# Patient Record
Sex: Female | Born: 1945 | Race: White | Hispanic: No | Marital: Single | Smoking: Current every day smoker
Health system: Southern US, Community
[De-identification: ages and names within clinical notes are randomized; demographics above are authoritative.]

## PROBLEM LIST (undated history)

## (undated) DIAGNOSIS — F32A Depression, unspecified: Secondary | ICD-10-CM

## (undated) DIAGNOSIS — M810 Age-related osteoporosis without current pathological fracture: Secondary | ICD-10-CM

## (undated) DIAGNOSIS — M1990 Unspecified osteoarthritis, unspecified site: Secondary | ICD-10-CM

## (undated) DIAGNOSIS — I509 Heart failure, unspecified: Secondary | ICD-10-CM

## (undated) DIAGNOSIS — F419 Anxiety disorder, unspecified: Secondary | ICD-10-CM

## (undated) DIAGNOSIS — F329 Major depressive disorder, single episode, unspecified: Secondary | ICD-10-CM

## (undated) DIAGNOSIS — E079 Disorder of thyroid, unspecified: Secondary | ICD-10-CM

## (undated) DIAGNOSIS — K649 Unspecified hemorrhoids: Secondary | ICD-10-CM

## (undated) DIAGNOSIS — D649 Anemia, unspecified: Secondary | ICD-10-CM

## (undated) DIAGNOSIS — E039 Hypothyroidism, unspecified: Secondary | ICD-10-CM

## (undated) DIAGNOSIS — I10 Essential (primary) hypertension: Secondary | ICD-10-CM

## (undated) DIAGNOSIS — M25519 Pain in unspecified shoulder: Secondary | ICD-10-CM

## (undated) DIAGNOSIS — K219 Gastro-esophageal reflux disease without esophagitis: Secondary | ICD-10-CM

## (undated) HISTORY — PX: HAND SURGERY: SHX662

## (undated) HISTORY — DX: Gastro-esophageal reflux disease without esophagitis: K21.9

## (undated) HISTORY — DX: Age-related osteoporosis without current pathological fracture: M81.0

## (undated) HISTORY — PX: COLONOSCOPY: SHX174

## (undated) HISTORY — DX: Disorder of thyroid, unspecified: E07.9

## (undated) HISTORY — DX: Unspecified hemorrhoids: K64.9

## (undated) HISTORY — PX: UPPER GI ENDOSCOPY: SHX6162

## (undated) HISTORY — PX: ABDOMINAL HYSTERECTOMY: SHX81

---

## 2004-02-27 ENCOUNTER — Other Ambulatory Visit: Payer: Self-pay

## 2004-08-21 ENCOUNTER — Ambulatory Visit: Payer: Self-pay | Admitting: Internal Medicine

## 2005-08-28 ENCOUNTER — Ambulatory Visit: Payer: Self-pay | Admitting: Internal Medicine

## 2006-08-31 ENCOUNTER — Ambulatory Visit: Payer: Self-pay | Admitting: Internal Medicine

## 2007-02-08 ENCOUNTER — Ambulatory Visit: Payer: Self-pay | Admitting: Internal Medicine

## 2010-01-14 ENCOUNTER — Ambulatory Visit: Payer: Self-pay | Admitting: Internal Medicine

## 2010-05-14 ENCOUNTER — Ambulatory Visit: Payer: Self-pay | Admitting: Unknown Physician Specialty

## 2011-01-29 ENCOUNTER — Ambulatory Visit: Payer: Self-pay | Admitting: Internal Medicine

## 2011-06-16 ENCOUNTER — Ambulatory Visit: Payer: Self-pay | Admitting: Internal Medicine

## 2012-02-29 ENCOUNTER — Ambulatory Visit: Payer: Self-pay | Admitting: Internal Medicine

## 2013-05-23 ENCOUNTER — Ambulatory Visit: Payer: Self-pay | Admitting: Physician Assistant

## 2013-09-26 ENCOUNTER — Ambulatory Visit: Payer: Self-pay | Admitting: Internal Medicine

## 2013-10-26 ENCOUNTER — Ambulatory Visit: Payer: Self-pay | Admitting: Physician Assistant

## 2013-12-07 ENCOUNTER — Ambulatory Visit: Payer: Self-pay | Admitting: Physician Assistant

## 2014-05-24 ENCOUNTER — Ambulatory Visit: Payer: Self-pay | Admitting: Physician Assistant

## 2015-03-08 DIAGNOSIS — R195 Other fecal abnormalities: Secondary | ICD-10-CM | POA: Insufficient documentation

## 2015-03-08 DIAGNOSIS — K219 Gastro-esophageal reflux disease without esophagitis: Secondary | ICD-10-CM | POA: Insufficient documentation

## 2015-03-08 DIAGNOSIS — D509 Iron deficiency anemia, unspecified: Secondary | ICD-10-CM | POA: Insufficient documentation

## 2015-05-15 ENCOUNTER — Encounter
Admission: RE | Disposition: A | Discharge status: Home / Self Care | Payer: Self-pay | Source: Ambulatory Visit | Attending: Unknown Physician Specialty

## 2015-05-15 ENCOUNTER — Ambulatory Visit: Payer: PPO | Admitting: Anesthesiology

## 2015-05-15 ENCOUNTER — Ambulatory Visit
Admission: RE | Admit: 2015-05-15 | Discharge: 2015-05-15 | Disposition: A | Discharge status: Home / Self Care | Payer: PPO | Source: Ambulatory Visit | Attending: Unknown Physician Specialty | Admitting: Unknown Physician Specialty

## 2015-05-15 DIAGNOSIS — K319 Disease of stomach and duodenum, unspecified: Secondary | ICD-10-CM | POA: Diagnosis not present

## 2015-05-15 DIAGNOSIS — K259 Gastric ulcer, unspecified as acute or chronic, without hemorrhage or perforation: Secondary | ICD-10-CM | POA: Diagnosis not present

## 2015-05-15 DIAGNOSIS — I10 Essential (primary) hypertension: Secondary | ICD-10-CM | POA: Insufficient documentation

## 2015-05-15 DIAGNOSIS — F419 Anxiety disorder, unspecified: Secondary | ICD-10-CM | POA: Diagnosis not present

## 2015-05-15 DIAGNOSIS — F17200 Nicotine dependence, unspecified, uncomplicated: Secondary | ICD-10-CM | POA: Diagnosis not present

## 2015-05-15 DIAGNOSIS — M1990 Unspecified osteoarthritis, unspecified site: Secondary | ICD-10-CM | POA: Diagnosis not present

## 2015-05-15 DIAGNOSIS — R195 Other fecal abnormalities: Secondary | ICD-10-CM | POA: Insufficient documentation

## 2015-05-15 DIAGNOSIS — F329 Major depressive disorder, single episode, unspecified: Secondary | ICD-10-CM | POA: Insufficient documentation

## 2015-05-15 DIAGNOSIS — K210 Gastro-esophageal reflux disease with esophagitis: Secondary | ICD-10-CM | POA: Insufficient documentation

## 2015-05-15 DIAGNOSIS — Z79899 Other long term (current) drug therapy: Secondary | ICD-10-CM | POA: Insufficient documentation

## 2015-05-15 DIAGNOSIS — D509 Iron deficiency anemia, unspecified: Secondary | ICD-10-CM | POA: Insufficient documentation

## 2015-05-15 DIAGNOSIS — K2970 Gastritis, unspecified, without bleeding: Secondary | ICD-10-CM | POA: Diagnosis not present

## 2015-05-15 DIAGNOSIS — D125 Benign neoplasm of sigmoid colon: Secondary | ICD-10-CM | POA: Insufficient documentation

## 2015-05-15 HISTORY — DX: Anxiety disorder, unspecified: F41.9

## 2015-05-15 HISTORY — DX: Essential (primary) hypertension: I10

## 2015-05-15 HISTORY — DX: Depression, unspecified: F32.A

## 2015-05-15 HISTORY — PX: ESOPHAGOGASTRODUODENOSCOPY (EGD) WITH PROPOFOL: SHX5813

## 2015-05-15 HISTORY — DX: Unspecified osteoarthritis, unspecified site: M19.90

## 2015-05-15 HISTORY — DX: Anemia, unspecified: D64.9

## 2015-05-15 HISTORY — DX: Major depressive disorder, single episode, unspecified: F32.9

## 2015-05-15 HISTORY — PX: COLONOSCOPY WITH PROPOFOL: SHX5780

## 2015-05-15 SURGERY — COLONOSCOPY WITH PROPOFOL
Anesthesia: General

## 2015-05-15 MED ORDER — EPHEDRINE SULFATE 50 MG/ML IJ SOLN
INTRAMUSCULAR | Status: DC | PRN
  Administered 2015-05-15 (×2): 10 mg via INTRAVENOUS

## 2015-05-15 MED ORDER — SODIUM CHLORIDE 0.9 % IV SOLN
INTRAVENOUS | Status: DC
Start: 2015-05-15 — End: 2015-05-17

## 2015-05-15 MED ORDER — LACTATED RINGERS IV SOLN
INTRAVENOUS | Status: DC | PRN
  Administered 2015-05-15: 10:00:00 via INTRAVENOUS

## 2015-05-15 MED ORDER — PROPOFOL 500 MG/50ML IV EMUL
INTRAVENOUS | Status: DC | PRN
  Administered 2015-05-15: 100 ug/kg/min via INTRAVENOUS

## 2015-05-15 MED ORDER — SODIUM CHLORIDE 0.9 % IV SOLN
INTRAVENOUS | Status: DC
  Administered 2015-05-15: 1000 mL via INTRAVENOUS

## 2015-05-15 MED ORDER — SODIUM CHLORIDE 0.9 % IV SOLN: INTRAVENOUS | Status: DC

## 2015-05-15 NOTE — H&P (Signed)
Primary Care Physician:  Lavera Guise, MD Primary Gastroenterologist:  Dr. Vira Agar  Pre-Procedure History & Physical: HPI:  Dawn Carpenter is a 69 y.o. female is here for an endoscopy and colonoscopy.   Past Medical History  Diagnosis Date  . Anxiety   . Depression   . Arthritis   . Hypertension   . Anemia     Past Surgical History  Procedure Laterality Date  . Abdominal hysterectomy    . Colonoscopy    . Upper gi endoscopy    . Hand surgery      for arthritis    Prior to Admission medications   Medication Sig Start Date End Date Taking? Authorizing Provider  Biotin 1 MG CAPS Take 1 tablet by mouth daily.   Yes Historical Provider, MD  calcium carbonate (TUMS - DOSED IN MG ELEMENTAL CALCIUM) 500 MG chewable tablet Chew 1 tablet by mouth daily.   Yes Historical Provider, MD  clonazePAM (KLONOPIN) 0.5 MG tablet Take 0.5 mg by mouth 2 (two) times daily as needed for anxiety.   Yes Historical Provider, MD  conjugated estrogens (PREMARIN) vaginal cream Place 1 Applicatorful vaginally daily.   Yes Historical Provider, MD  escitalopram (LEXAPRO) 10 MG tablet Take 10 mg by mouth daily.   Yes Historical Provider, MD  Ferrous Fumarate-Folic Acid (HEMATINIC/FOLIC ACID) 992-4 MG TABS Take 1 tablet by mouth daily.   Yes Historical Provider, MD  levothyroxine (SYNTHROID, LEVOTHROID) 50 MCG tablet Take 50 mcg by mouth daily before breakfast.   Yes Historical Provider, MD  mirtazapine (REMERON) 30 MG tablet Take 30 mg by mouth at bedtime.   Yes Historical Provider, MD  Multiple Vitamin (MULTIVITAMIN) capsule Take 1 capsule by mouth daily.   Yes Historical Provider, MD  promethazine (PHENERGAN) 25 MG tablet Take 25 mg by mouth every 6 (six) hours as needed for nausea or vomiting.   Yes Historical Provider, MD  valsartan (DIOVAN) 160 MG tablet Take 160 mg by mouth daily.   Yes Historical Provider, MD    Allergies as of 03/12/2015  . (Not on File)    History reviewed. No pertinent  family history.  Social History   Social History  . Marital Status: Single    Spouse Name: N/A  . Number of Children: N/A  . Years of Education: N/A   Occupational History  . Not on file.   Social History Main Topics  . Smoking status: Current Every Day Smoker  . Smokeless tobacco: Not on file  . Alcohol Use: Not on file  . Drug Use: Not on file  . Sexual Activity: Not on file   Other Topics Concern  . Not on file   Social History Narrative  . No narrative on file    Review of Systems: See HPI, otherwise negative ROS  Physical Exam: BP 158/87 mmHg  Pulse 67  Temp(Src) 97.9 F (36.6 C) (Tympanic)  Resp 15  Ht 5\' 4"  (1.626 m)  Wt 54.432 kg (120 lb)  BMI 20.59 kg/m2  SpO2 97% General:   Alert,  pleasant and cooperative in NAD Head:  Normocephalic and atraumatic. Neck:  Supple; no masses or thyromegaly. Lungs:  Clear throughout to auscultation.    Heart:  Regular rate and rhythm. Abdomen:  Soft, nontender and nondistended. Normal bowel sounds, without guarding, and without rebound.   Neurologic:  Alert and  oriented x4;  grossly normal neurologically.  Impression/Plan: Dawn Carpenter is here for an endoscopy and colonoscopy to be performed for  heme positive stool  Risks, benefits, limitations, and alternatives regarding  endoscopy and colonoscopy have been reviewed with the patient.  Questions have been answered.  All parties agreeable.   Gaylyn Cheers, MD  05/15/2015, 1:58 PM

## 2015-05-15 NOTE — Anesthesia Postprocedure Evaluation (Signed)
  Anesthesia Post-op Note  Patient: Dawn Carpenter  Procedure(s) Performed: Procedure(s): COLONOSCOPY WITH PROPOFOL (N/A) ESOPHAGOGASTRODUODENOSCOPY (EGD) WITH PROPOFOL (N/A)  Anesthesia type:No value filed.  Patient location: PACU  Post pain: Pain level controlled  Post assessment: Post-op Vital signs reviewed, Patient's Cardiovascular Status Stable, Respiratory Function Stable, Patent Airway and No signs of Nausea or vomiting  Post vital signs: Reviewed and stable  Last Vitals:  Filed Vitals:   05/15/15 1050  BP: 158/87  Pulse: 67  Temp:   Resp: 15    Level of consciousness: awake, alert  and patient cooperative  Complications: No apparent anesthesia complications

## 2015-05-15 NOTE — Op Note (Signed)
[facility] Gastroenterology Patient Name: Dawn Carpenter Procedure Date: 05/15/2015 9:23 AM MRN: [id number] Account #: [id number] Date of Birth: 05-Sep-1945 Admit Type: Outpatient Age: 69 Room: [facility] ENDO ROOM 4 Gender: Female Note Status: Finalized Procedure:         Upper GI endoscopy Indications:       Iron deficiency anemia, Heme positive stool Providers:         Manya Silvas, MD Referring MD:      Lavera Guise, MD (Referring MD) Medicines:         Propofol per Anesthesia Complications:     No immediate complications. Procedure:         Pre-Anesthesia Assessment:                    - After reviewing the risks and benefits, the patient was                     deemed in satisfactory condition to undergo the procedure.                    After obtaining informed consent, the endoscope was passed                     under direct vision. Throughout the procedure, the                     patient's blood pressure, pulse, and oxygen saturations                     were monitored continuously. The Endoscope was introduced                     through the mouth, and advanced to the second part of                     duodenum. The upper GI endoscopy was accomplished without                     difficulty. The patient tolerated the procedure well. Findings:      LA Grade C (one or more mucosal breaks continuous between tops of 2 or       more mucosal folds, less than 75% circumference) esophagitis with no       bleeding was found 39 cm from the incisors. This extended to about 27cm       from the teeth. Biopsies were taken with a cold forceps for histology.      Two non-bleeding cratered gastric ulcers with no stigmata of bleeding       were found in the gastric antrum. The largest lesion was 3 mm in largest       dimension.      Diffuse and patchy moderate inflammation characterized by erythema and       granularity was found in the gastric antrum. Biopsies were  taken with a       cold forceps for Helicobacter pylori testing. Biopsies were taken with a       cold forceps for histology.      Diffuse mildly erythematous mucosa without active bleeding and with no       stigmata of bleeding was found in the duodenal bulb. 2ed portion was       normal. Impression:        - LA Grade C reflux esophagitis. Biopsied.                    -  Gastric ulcers with clean base.                    - Gastritis. Biopsied.                    - Erythematous duodenopathy. Recommendation:    - Await pathology results. Avoid Advil, Aleve, Ibuprofen,                     etc. Take medicine for stomach. Manya Silvas, MD 05/15/2015 10:24:42 AM This report has been signed electronically. Number of Addenda: 0 Note Initiated On: 05/15/2015 9:23 AM      [facility]

## 2015-05-15 NOTE — Transfer of Care (Signed)
Immediate Anesthesia Transfer of Care Note  Patient: Coty Student  Procedure(s) Performed: Procedure(s): COLONOSCOPY WITH PROPOFOL (N/A) ESOPHAGOGASTRODUODENOSCOPY (EGD) WITH PROPOFOL (N/A)  Patient Location: PACU  Anesthesia Type:General  Level of Consciousness: awake and alert   Airway & Oxygen Therapy: Patient Spontanous Breathing and Patient connected to nasal cannula oxygen  Post-op Assessment: Report given to RN and Post -op Vital signs reviewed and stable  Post vital signs: Reviewed and stable  Last Vitals:  Filed Vitals:   05/15/15 1050  BP: 158/87  Pulse: 67  Temp:   Resp: 15    Complications: No apparent anesthesia complications

## 2015-05-15 NOTE — Op Note (Signed)
[facility] Gastroenterology Patient Name: Dawn Carpenter Procedure Date: 05/15/2015 9:23 AM MRN: [id number] Account #: [id number] Date of Birth: 1946-02-23 Admit Type: Outpatient Age: 68 Room: [facility] ENDO ROOM 4 Gender: Female Note Status: Finalized Procedure:         Colonoscopy Indications:       Heme positive stool, Unexplained iron deficiency anemia Providers:         Manya Silvas, MD Referring MD:      Lavera Guise, MD (Referring MD) Medicines:         Propofol per Anesthesia Complications:     No immediate complications. Procedure:         Pre-Anesthesia Assessment:                    - After reviewing the risks and benefits, the patient was                     deemed in satisfactory condition to undergo the procedure.                    After obtaining informed consent, the colonoscope was                     passed under direct vision. Throughout the procedure, the                     patient's blood pressure, pulse, and oxygen saturations                     were monitored continuously. The Colonoscope was                     introduced through the anus and advanced to the the cecum,                     identified by appendiceal orifice and ileocecal valve. The                     colonoscopy was somewhat difficult due to restricted                     mobility of the colon. Successful completion of the                     procedure was aided by applying abdominal pressure. The                     patient tolerated the procedure well. The quality of the                     bowel preparation was excellent. Findings:      The distal ileum was normal      Internal hemorrhoids were found. The hemorrhoids were small and Grade I       (internal hemorrhoids that do not prolapse).      A diminutive polyp was found in the recto-sigmoid colon. The polyp was       sessile. The polyp was removed with a jumbo cold forceps. Resection and       retrieval were  complete.      The exam was otherwise without abnormality. No findings of anything that       could cause iron def anemia. Impression:        - Internal hemorrhoids.                    -  One diminutive polyp at the recto-sigmoid colon.                     Resected and retrieved.                    - The examination was otherwise normal. Recommendation:    - Await pathology results. Manya Silvas, MD 05/15/2015 10:08:19 AM This report has been signed electronically. Number of Addenda: 0 Note Initiated On: 05/15/2015 9:23 AM Scope Withdrawal Time: 0 hours 15 minutes 0 seconds  Total Procedure Duration: 0 hours 24 minutes 53 seconds       [facility]

## 2015-05-15 NOTE — Anesthesia Preprocedure Evaluation (Addendum)
Anesthesia Evaluation  Patient identified by MRN, date of birth, ID band  Reviewed: Allergy & Precautions, NPO status , reviewed documented beta blocker date and time   History of Anesthesia Complications (+) AWARENESS UNDER ANESTHESIA  Airway Mallampati: II  TM Distance: >3 FB     Dental  (+) Missing, Dental Advisory Given   Pulmonary Current Smoker,    Pulmonary exam normal        Cardiovascular hypertension,  Rhythm:Regular Rate:Normal     Neuro/Psych Anxiety    GI/Hepatic   Endo/Other  Hypothyroidism   Renal/GU      Musculoskeletal  (+) Arthritis ,   Abdominal Normal abdominal exam  (+)   Peds  Hematology  (+) anemia ,   Anesthesia Other Findings   Reproductive/Obstetrics                           Anesthesia Physical Anesthesia Plan  ASA: III  Anesthesia Plan: General   Post-op Pain Management:    Induction:   Airway Management Planned:   Additional Equipment:   Intra-op Plan:   Post-operative Plan:   Informed Consent:   Plan Discussed with: CRNA  Anesthesia Plan Comments:         Anesthesia Quick Evaluation

## 2015-05-16 ENCOUNTER — Encounter: Payer: Self-pay | Admitting: Unknown Physician Specialty

## 2015-05-16 LAB — SURGICAL PATHOLOGY

## 2015-05-23 NOTE — Addendum Note (Signed)
Addendum  created 05/23/15 2006 by Wilmer Floor, MD   Modules edited: Anesthesia Attestations

## 2015-05-27 ENCOUNTER — Other Ambulatory Visit: Payer: Self-pay | Admitting: Internal Medicine

## 2015-05-27 DIAGNOSIS — Z1231 Encounter for screening mammogram for malignant neoplasm of breast: Secondary | ICD-10-CM

## 2015-06-06 ENCOUNTER — Ambulatory Visit
Admission: RE | Admit: 2015-06-06 | Discharge: 2015-06-06 | Disposition: A | Discharge status: Home / Self Care | Payer: PPO | Source: Ambulatory Visit | Attending: Internal Medicine | Admitting: Internal Medicine

## 2015-06-06 DIAGNOSIS — Z1231 Encounter for screening mammogram for malignant neoplasm of breast: Secondary | ICD-10-CM | POA: Diagnosis not present

## 2015-09-24 DIAGNOSIS — D509 Iron deficiency anemia, unspecified: Secondary | ICD-10-CM | POA: Diagnosis not present

## 2015-09-24 DIAGNOSIS — F333 Major depressive disorder, recurrent, severe with psychotic symptoms: Secondary | ICD-10-CM | POA: Diagnosis not present

## 2015-09-24 DIAGNOSIS — I10 Essential (primary) hypertension: Secondary | ICD-10-CM | POA: Diagnosis not present

## 2015-09-24 DIAGNOSIS — K580 Irritable bowel syndrome with diarrhea: Secondary | ICD-10-CM | POA: Diagnosis not present

## 2015-09-24 DIAGNOSIS — E039 Hypothyroidism, unspecified: Secondary | ICD-10-CM | POA: Diagnosis not present

## 2015-11-21 DIAGNOSIS — F333 Major depressive disorder, recurrent, severe with psychotic symptoms: Secondary | ICD-10-CM | POA: Diagnosis not present

## 2015-11-21 DIAGNOSIS — K580 Irritable bowel syndrome with diarrhea: Secondary | ICD-10-CM | POA: Diagnosis not present

## 2015-11-21 DIAGNOSIS — F17210 Nicotine dependence, cigarettes, uncomplicated: Secondary | ICD-10-CM | POA: Diagnosis not present

## 2015-11-21 DIAGNOSIS — E039 Hypothyroidism, unspecified: Secondary | ICD-10-CM | POA: Diagnosis not present

## 2016-01-14 ENCOUNTER — Telehealth: Payer: Self-pay | Admitting: *Deleted

## 2016-01-14 NOTE — Telephone Encounter (Signed)
Received referral for initial lung cancer screening scan. Contacted patient and obtained smoking history,(current smoker, 30 pack year history) as well as answering questions related to screening process. Patient denies signs of lung cancer such as weight loss or hemoptysis. Patient denies comorbidity that would prevent curative treatment if lung cancer were found. Patient is tentatively scheduled for shared decision making visit and CT scan on 01/24/16 at 2pm, pending insurance approval from business office.

## 2016-01-23 ENCOUNTER — Other Ambulatory Visit: Payer: Self-pay | Admitting: Family Medicine

## 2016-01-23 DIAGNOSIS — Z87891 Personal history of nicotine dependence: Secondary | ICD-10-CM

## 2016-01-24 ENCOUNTER — Encounter: Payer: Self-pay | Admitting: Family Medicine

## 2016-01-24 ENCOUNTER — Ambulatory Visit
Admission: RE | Admit: 2016-01-24 | Discharge: 2016-01-24 | Disposition: A | Discharge status: Home / Self Care | Payer: PPO | Source: Ambulatory Visit | Attending: Family Medicine | Admitting: Family Medicine

## 2016-01-24 ENCOUNTER — Inpatient Hospital Stay: Payer: PPO | Attending: Family Medicine | Admitting: Family Medicine

## 2016-01-24 DIAGNOSIS — Z23 Encounter for immunization: Secondary | ICD-10-CM | POA: Diagnosis not present

## 2016-01-24 DIAGNOSIS — Z87891 Personal history of nicotine dependence: Secondary | ICD-10-CM | POA: Insufficient documentation

## 2016-01-24 DIAGNOSIS — I2510 Atherosclerotic heart disease of native coronary artery without angina pectoris: Secondary | ICD-10-CM | POA: Diagnosis not present

## 2016-01-24 DIAGNOSIS — R911 Solitary pulmonary nodule: Secondary | ICD-10-CM | POA: Diagnosis not present

## 2016-01-24 DIAGNOSIS — I700 Atherosclerosis of aorta: Secondary | ICD-10-CM | POA: Insufficient documentation

## 2016-01-24 DIAGNOSIS — D509 Iron deficiency anemia, unspecified: Secondary | ICD-10-CM | POA: Diagnosis not present

## 2016-01-24 DIAGNOSIS — Z122 Encounter for screening for malignant neoplasm of respiratory organs: Secondary | ICD-10-CM | POA: Diagnosis not present

## 2016-01-24 DIAGNOSIS — E039 Hypothyroidism, unspecified: Secondary | ICD-10-CM | POA: Diagnosis not present

## 2016-01-24 DIAGNOSIS — I10 Essential (primary) hypertension: Secondary | ICD-10-CM | POA: Diagnosis not present

## 2016-01-24 DIAGNOSIS — F17210 Nicotine dependence, cigarettes, uncomplicated: Secondary | ICD-10-CM | POA: Diagnosis not present

## 2016-01-24 DIAGNOSIS — Z0001 Encounter for general adult medical examination with abnormal findings: Secondary | ICD-10-CM | POA: Diagnosis not present

## 2016-01-24 DIAGNOSIS — F333 Major depressive disorder, recurrent, severe with psychotic symptoms: Secondary | ICD-10-CM | POA: Diagnosis not present

## 2016-01-24 DIAGNOSIS — M810 Age-related osteoporosis without current pathological fracture: Secondary | ICD-10-CM | POA: Diagnosis not present

## 2016-01-24 NOTE — Progress Notes (Signed)
In accordance with CMS guidelines, patient has meet eligibility criteria including age, absence of signs or symptoms of lung cancer, the specific calculation of cigarette smoking pack-years was 30 years and is a current smoker.   A shared decision-making session was conducted prior to the performance of CT scan. This includes one or more decision aids, includes benefits and harms of screening, follow-up diagnostic testing, over-diagnosis, false positive rate, and total radiation exposure.  Counseling on the importance of adherence to annual lung cancer LDCT screening, impact of co-morbidities, and ability or willingness to undergo diagnosis and treatment is imperative for compliance of the program.  Counseling on the importance of continued smoking cessation for former smokers; the importance of smoking cessation for current smokers and information about tobacco cessation interventions have been given to patient including the [place] [facility], 1800 quit [place], as well as Cancer Center specific smoking cessation programs.  Written order for lung cancer screening with LDCT has been given to the patient and any and all questions have been answered to the best of my abilities.   Yearly follow up will be scheduled by Shawn Perkins, Thoracic Navigator.   

## 2016-01-27 ENCOUNTER — Other Ambulatory Visit: Payer: Self-pay | Admitting: Family Medicine

## 2016-02-04 DIAGNOSIS — F17210 Nicotine dependence, cigarettes, uncomplicated: Secondary | ICD-10-CM | POA: Diagnosis not present

## 2016-02-04 DIAGNOSIS — R0602 Shortness of breath: Secondary | ICD-10-CM | POA: Diagnosis not present

## 2016-02-04 DIAGNOSIS — F419 Anxiety disorder, unspecified: Secondary | ICD-10-CM | POA: Diagnosis not present

## 2016-02-04 DIAGNOSIS — R911 Solitary pulmonary nodule: Secondary | ICD-10-CM | POA: Diagnosis not present

## 2016-02-10 ENCOUNTER — Other Ambulatory Visit: Payer: Self-pay | Admitting: Internal Medicine

## 2016-02-10 DIAGNOSIS — R911 Solitary pulmonary nodule: Secondary | ICD-10-CM

## 2016-02-19 DIAGNOSIS — R0602 Shortness of breath: Secondary | ICD-10-CM | POA: Diagnosis not present

## 2016-02-19 LAB — PULMONARY FUNCTION TEST

## 2016-02-25 ENCOUNTER — Encounter
Admission: RE | Admit: 2016-02-25 | Discharge: 2016-02-25 | Disposition: A | Discharge status: Home / Self Care | Payer: PPO | Source: Ambulatory Visit | Attending: Internal Medicine | Admitting: Internal Medicine

## 2016-02-25 DIAGNOSIS — R911 Solitary pulmonary nodule: Secondary | ICD-10-CM | POA: Insufficient documentation

## 2016-02-25 LAB — GLUCOSE, CAPILLARY: Glucose-Capillary: 96 mg/dL (ref 65–99)

## 2016-02-25 MED ORDER — FLUDEOXYGLUCOSE F - 18 (FDG) INJECTION
12.0700 | Freq: Once | INTRAVENOUS | Status: AC | PRN
  Administered 2016-02-25: 12.07 via INTRAVENOUS

## 2016-03-05 DIAGNOSIS — F17210 Nicotine dependence, cigarettes, uncomplicated: Secondary | ICD-10-CM | POA: Diagnosis not present

## 2016-03-05 DIAGNOSIS — F419 Anxiety disorder, unspecified: Secondary | ICD-10-CM | POA: Diagnosis not present

## 2016-03-05 DIAGNOSIS — R0602 Shortness of breath: Secondary | ICD-10-CM | POA: Diagnosis not present

## 2016-03-05 DIAGNOSIS — R911 Solitary pulmonary nodule: Secondary | ICD-10-CM | POA: Diagnosis not present

## 2016-03-18 ENCOUNTER — Other Ambulatory Visit: Payer: Self-pay | Admitting: Internal Medicine

## 2016-03-18 DIAGNOSIS — R911 Solitary pulmonary nodule: Secondary | ICD-10-CM

## 2016-03-30 ENCOUNTER — Other Ambulatory Visit: Payer: Self-pay | Admitting: Radiology

## 2016-03-31 ENCOUNTER — Ambulatory Visit
Admission: RE | Admit: 2016-03-31 | Discharge: 2016-03-31 | Disposition: A | Discharge status: Home / Self Care | Payer: PPO | Source: Ambulatory Visit | Attending: Interventional Radiology | Admitting: Interventional Radiology

## 2016-03-31 ENCOUNTER — Ambulatory Visit
Admission: RE | Admit: 2016-03-31 | Discharge: 2016-03-31 | Disposition: A | Discharge status: Home / Self Care | Payer: PPO | Source: Ambulatory Visit | Attending: Internal Medicine | Admitting: Internal Medicine

## 2016-03-31 DIAGNOSIS — F419 Anxiety disorder, unspecified: Secondary | ICD-10-CM | POA: Insufficient documentation

## 2016-03-31 DIAGNOSIS — I10 Essential (primary) hypertension: Secondary | ICD-10-CM | POA: Insufficient documentation

## 2016-03-31 DIAGNOSIS — R911 Solitary pulmonary nodule: Secondary | ICD-10-CM

## 2016-03-31 DIAGNOSIS — M1990 Unspecified osteoarthritis, unspecified site: Secondary | ICD-10-CM | POA: Diagnosis not present

## 2016-03-31 DIAGNOSIS — F329 Major depressive disorder, single episode, unspecified: Secondary | ICD-10-CM | POA: Diagnosis not present

## 2016-03-31 DIAGNOSIS — J939 Pneumothorax, unspecified: Secondary | ICD-10-CM | POA: Diagnosis not present

## 2016-03-31 DIAGNOSIS — D649 Anemia, unspecified: Secondary | ICD-10-CM | POA: Insufficient documentation

## 2016-03-31 DIAGNOSIS — R918 Other nonspecific abnormal finding of lung field: Secondary | ICD-10-CM | POA: Insufficient documentation

## 2016-03-31 DIAGNOSIS — F17210 Nicotine dependence, cigarettes, uncomplicated: Secondary | ICD-10-CM | POA: Insufficient documentation

## 2016-03-31 DIAGNOSIS — J95811 Postprocedural pneumothorax: Secondary | ICD-10-CM

## 2016-03-31 LAB — CBC WITH DIFFERENTIAL/PLATELET
BASOS ABS: 0.1 10*3/uL (ref 0–0.1)
Basophils Relative: 1 %
EOS PCT: 4 %
Eosinophils Absolute: 0.2 10*3/uL (ref 0–0.7)
HCT: 34.1 % — ABNORMAL LOW (ref 35.0–47.0)
Hemoglobin: 11.9 g/dL — ABNORMAL LOW (ref 12.0–16.0)
LYMPHS PCT: 26 %
Lymphs Abs: 1.6 10*3/uL (ref 1.0–3.6)
MCH: 30.3 pg (ref 26.0–34.0)
MCHC: 34.9 g/dL (ref 32.0–36.0)
MCV: 86.7 fL (ref 80.0–100.0)
Monocytes Absolute: 0.7 10*3/uL (ref 0.2–0.9)
Monocytes Relative: 11 %
NEUTROS ABS: 3.7 10*3/uL (ref 1.4–6.5)
Neutrophils Relative %: 58 %
PLATELETS: 325 10*3/uL (ref 150–440)
RBC: 3.94 MIL/uL (ref 3.80–5.20)
RDW: 21.1 % — ABNORMAL HIGH (ref 11.5–14.5)
WBC: 6.4 10*3/uL (ref 3.6–11.0)

## 2016-03-31 LAB — PROTIME-INR
INR: 0.9
Prothrombin Time: 12.1 seconds (ref 11.4–15.2)

## 2016-03-31 MED ORDER — FENTANYL CITRATE (PF) 100 MCG/2ML IJ SOLN
INTRAMUSCULAR | Status: AC | PRN
  Administered 2016-03-31: 50 ug via INTRAVENOUS
  Administered 2016-03-31: 25 ug via INTRAVENOUS

## 2016-03-31 MED ORDER — MIDAZOLAM HCL 5 MG/5ML IJ SOLN
INTRAMUSCULAR | Status: AC | PRN
  Administered 2016-03-31: 1 mg via INTRAVENOUS
  Administered 2016-03-31: 0.5 mg via INTRAVENOUS
  Administered 2016-03-31: 1 mg via INTRAVENOUS

## 2016-03-31 MED ORDER — HYDROCODONE-ACETAMINOPHEN 5-325 MG PO TABS: 1.0000 | ORAL_TABLET | ORAL | Status: DC | PRN

## 2016-03-31 MED ORDER — SODIUM CHLORIDE 0.9 % IV SOLN
INTRAVENOUS | Status: DC
  Administered 2016-03-31: 11:00:00 via INTRAVENOUS

## 2016-03-31 MED ORDER — FENTANYL CITRATE (PF) 100 MCG/2ML IJ SOLN
INTRAMUSCULAR | Status: AC
  Filled 2016-03-31: qty 2

## 2016-03-31 MED ORDER — MIDAZOLAM HCL 5 MG/5ML IJ SOLN
INTRAMUSCULAR | Status: AC
  Filled 2016-03-31: qty 5

## 2016-03-31 MED ORDER — FENTANYL CITRATE (PF) 100 MCG/2ML IJ SOLN
INTRAMUSCULAR | Status: DC
Start: 2016-03-31 — End: 2016-03-31
  Filled 2016-03-31: qty 2

## 2016-03-31 NOTE — H&P (Signed)
Chief Complaint: Patient was seen in consultation today for biopsy of a left upper lobe lung nodule at the request of [place] A  Referring Physician(s): [place] A  Patient Status: Outpatient  History of Present Illness: Dawn Carpenter is a 70 y.o. 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  Due to peripheral location of the lesion, she has been referred for CT guided percutaneous biopsy of the lesion.  Past Medical History:  Diagnosis Date  . Anemia   . Anxiety   . Arthritis   . Depression   . Hypertension     Past Surgical History:  Procedure Laterality Date  . ABDOMINAL HYSTERECTOMY    . COLONOSCOPY    . COLONOSCOPY WITH PROPOFOL N/A 05/15/2015   Procedure: COLONOSCOPY WITH PROPOFOL;  Surgeon: Manya Silvas, MD;  Location: [facility] ENDOSCOPY;  Service: Endoscopy;  Laterality: N/A;  . ESOPHAGOGASTRODUODENOSCOPY (EGD) WITH PROPOFOL N/A 05/15/2015   Procedure: ESOPHAGOGASTRODUODENOSCOPY (EGD) WITH PROPOFOL;  Surgeon: Manya Silvas, MD;  Location: [facility] ENDOSCOPY;  Service: Endoscopy;  Laterality: N/A;  . HAND SURGERY     for arthritis  . UPPER GI ENDOSCOPY      Allergies: Review of patient's allergies indicates not on file.  Medications: Prior to Admission medications   Medication Sig Start Date End Date Taking? Authorizing Provider  Biotin 1 MG CAPS Take 1 tablet by mouth daily.   Yes Historical Provider, MD  calcium carbonate (TUMS - DOSED IN MG ELEMENTAL CALCIUM) 500 MG chewable tablet Chew 1 tablet by mouth daily.   Yes Historical Provider, MD  escitalopram (LEXAPRO) 10 MG tablet Take 10 mg by mouth daily.   Yes Historical Provider, MD  Ferrous Fumarate-Folic Acid (HEMATINIC/FOLIC ACID) 99991111 MG TABS Take 1 tablet by mouth daily.   Yes Historical Provider, MD  levothyroxine (SYNTHROID, LEVOTHROID) 50 MCG tablet Take 50 mcg by mouth daily before breakfast.   Yes Historical Provider, MD    losartan (COZAAR) 50 MG tablet Take 50 mg by mouth daily.   Yes Historical Provider, MD  mirtazapine (REMERON) 30 MG tablet Take 30 mg by mouth at bedtime.   Yes Historical Provider, MD  Multiple Vitamin (MULTIVITAMIN) capsule Take 1 capsule by mouth daily.   Yes Historical Provider, MD  omeprazole (PRILOSEC) 40 MG capsule Take 40 mg by mouth daily.   Yes Historical Provider, MD  clonazePAM (KLONOPIN) 0.5 MG tablet Take 0.5 mg by mouth 2 (two) times daily as needed for anxiety.    Historical Provider, MD  conjugated estrogens (PREMARIN) vaginal cream Place 1 Applicatorful vaginally daily.    Historical Provider, MD  promethazine (PHENERGAN) 25 MG tablet Take 25 mg by mouth every 6 (six) hours as needed for nausea or vomiting.    Historical Provider, MD  valsartan (DIOVAN) 160 MG tablet Take 160 mg by mouth daily.    Historical Provider, MD     Family History  Problem Relation Age of Onset  . Breast cancer Neg Hx     Social History   Social History  . Marital status: Single    Spouse name: N/A  . Number of children: N/A  . Years of education: N/A   Social History Main Topics  . Smoking status: Current Every Day Smoker    Packs/day: 0.75    Years: 40.00  . Smokeless tobacco: Never Used  . Alcohol use 8.4 oz/week    14 Glasses of wine per week  .  Drug use: No  . Sexual activity: Not Asked   Other Topics Concern  . None   Social History Narrative  . None    Review of Systems: A 12 point ROS discussed and pertinent positives are indicated in the HPI above.  All other systems are negative.  Review of Systems  Constitutional: Negative.   Respiratory: Negative.   Cardiovascular: Negative.   Gastrointestinal: Negative.   Genitourinary: Negative.   Musculoskeletal: Negative.   Neurological: Negative.   All other systems reviewed and are negative.   Vital Signs: BP (!) 143/71   Pulse 77   Temp 97.9 F (36.6 C) (Oral)   Resp 15   Ht 5\' 4"  (1.626 m)   Wt 107 lb (48.5  kg)   SpO2 94%   BMI 18.37 kg/m   Physical Exam  Constitutional: She is oriented to person, place, and time. She appears well-developed and well-nourished. No distress.  HENT:  Head: Normocephalic and atraumatic.  Neck: Neck supple. No JVD present. No tracheal deviation present. No thyromegaly present.  Cardiovascular: Normal rate and regular rhythm.  Exam reveals no gallop and no friction rub.   Murmur heard. Mild, 1/6 systolic ejection murmur  Pulmonary/Chest: Effort normal and breath sounds normal. No stridor. No respiratory distress. She has no wheezes. She has no rales.  Abdominal: Soft. Bowel sounds are normal. She exhibits no distension and no mass. There is no tenderness. There is no rebound and no guarding.  Lymphadenopathy:    She has no cervical adenopathy.  Neurological: She is alert and oriented to person, place, and time.  Skin: She is not diaphoretic.    Imaging: No results found.  Labs:  CBC:  Recent Labs  03/31/16 1022  WBC 6.4  HGB 11.9*  HCT 34.1*  PLT 325    COAGS:  Recent Labs  03/31/16 1022  INR 0.90    Assessment and Plan:  For CT guided biopsy of LUL lung mass today.  Imaging reviewed.  Risks reviewed with patient and her daughter.  Risks and benefits discussed with the patient including, but not limited to bleeding, hemoptysis, respiratory failure requiring intubation, infection, pneumothorax requiring chest tube placement, stroke from air embolism or even death. All of the patient's questions were answered, patient is agreeable to proceed. Consent signed and in chart.  Thank you for this interesting consult.  I greatly enjoyed meeting Marlen Repa V. and look forward to participating in their care.  A copy of this report was sent to the requesting provider on this date.  Electronically SignedAletta Edouard T 03/31/2016, 11:29 AM   I spent a total of  30 Minutes in face to face in clinical consultation, greater than 50% of which  was counseling/coordinating care for biopsy of LUL mass.

## 2016-03-31 NOTE — Procedures (Signed)
Interventional Radiology Procedure Note  Procedure:  CT guided biopsy of LUL lung mass  Complications:  Tiny PTX  Estimated Blood Loss: < 10 mL  Findings:  From posterior approach, 17 G needle advanced to posterior margin of peripheral LUL mass. 18 G core biopsy x 2.  Tiny posterior PTX treated with aspiration through outer needle.  Completion CT shows tiny cresent of air in pleural space.  Plan:  CXR in 2 hours; 2 hour bedrest.  Discharge home if CXR shows no signif PTX.  Venetia Night. Kathlene Cote, M.D Pager:  [phone]

## 2016-04-02 LAB — SURGICAL PATHOLOGY

## 2016-04-07 DIAGNOSIS — R911 Solitary pulmonary nodule: Secondary | ICD-10-CM | POA: Diagnosis not present

## 2016-04-07 DIAGNOSIS — I2584 Coronary atherosclerosis due to calcified coronary lesion: Secondary | ICD-10-CM | POA: Diagnosis not present

## 2016-04-07 DIAGNOSIS — R0602 Shortness of breath: Secondary | ICD-10-CM | POA: Diagnosis not present

## 2016-04-07 DIAGNOSIS — F17210 Nicotine dependence, cigarettes, uncomplicated: Secondary | ICD-10-CM | POA: Diagnosis not present

## 2016-04-08 ENCOUNTER — Telehealth: Payer: Self-pay | Admitting: Cardiothoracic Surgery

## 2016-04-08 NOTE — Telephone Encounter (Signed)
Left voice message for patient to call and schedule appointment with Dr. Genevive Bi for possible lung cancer resection. Referred by the [place]

## 2016-04-09 DIAGNOSIS — R079 Chest pain, unspecified: Secondary | ICD-10-CM | POA: Diagnosis not present

## 2016-04-09 DIAGNOSIS — R0602 Shortness of breath: Secondary | ICD-10-CM | POA: Diagnosis not present

## 2016-04-09 NOTE — Telephone Encounter (Signed)
Left voice message for patient to call and schedule appointment with Dr. Genevive Bi for possible lung cancer resection. Referred by the [place]

## 2016-04-10 ENCOUNTER — Encounter: Payer: Self-pay | Admitting: Cardiothoracic Surgery

## 2016-04-10 NOTE — Telephone Encounter (Signed)
Left voice message for patient to call and schedule appointment with Dr. Genevive Bi for possible lung cancer resection. Referred by the cancer center. Letter sent.

## 2016-04-27 ENCOUNTER — Other Ambulatory Visit: Payer: Self-pay

## 2016-05-01 ENCOUNTER — Encounter: Payer: Self-pay | Admitting: Cardiothoracic Surgery

## 2016-05-01 ENCOUNTER — Ambulatory Visit (INDEPENDENT_AMBULATORY_CARE_PROVIDER_SITE_OTHER): Payer: PPO | Admitting: Cardiothoracic Surgery

## 2016-05-01 VITALS — BP 205/97 | HR 75 | Temp 98.6°F | Resp 18 | Wt 107.0 lb

## 2016-05-01 DIAGNOSIS — R911 Solitary pulmonary nodule: Secondary | ICD-10-CM | POA: Diagnosis not present

## 2016-05-01 NOTE — Progress Notes (Signed)
Patient ID: Dawn Bohmer., female   DOB: August 20, 1945, 70 y.o.   MRN: [id number]  Chief Complaint  Patient presents with  . New Patient (Initial Visit)    Left Upper Lobe Nodule    Referred By Dr. Humphrey Rolls Reason for Referral LUL mass  HPI Location, Quality, Duration, Severity, Timing, Context, Modifying Factors, Associated Signs and Symptoms.  Dawn Carpenter is a 70 y.o. female.  She is a lifelong smoker and is followed by Dr. Yancey Flemings. She had a low dose chest CT in August of this year. She smoked most of her life and smoked at least a pack cigarettes a day. She's currently down to half a pack. The CT scan showed a left upper lobe rounded pulmonary nodule 4 mm that was thought to be clinically insignificant but did show a 1.5 cm irregular mass. 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 The patient comes in today for continued follow-up of her left upper lobe mass.  The patient states that she's been smoking at least a half pack cigarettes a day and continues to smoke. She is trying to cut back. She is able to walk up 2 flights of stairs without significant limitations. She had a complete set of pulmonary functions done which revealed a normal FEV1 of 87% but a DLCO of only 37%. She has had some cough. She feels that her coughing is made worse with electronic cigarettes.  She denied any hemoptysis.   Past Medical History:  Diagnosis Date  . Anemia   . Anxiety   . Arthritis   . Depression   . Hypertension     Past Surgical History:  Procedure Laterality Date  . ABDOMINAL HYSTERECTOMY    . COLONOSCOPY    . COLONOSCOPY WITH PROPOFOL N/A 05/15/2015   Procedure: COLONOSCOPY WITH PROPOFOL;  Surgeon: Manya Silvas, MD;  Location: [facility] ENDOSCOPY;  Service: Endoscopy;  Laterality: N/A;  . ESOPHAGOGASTRODUODENOSCOPY (EGD) WITH PROPOFOL N/A 05/15/2015   Procedure:  ESOPHAGOGASTRODUODENOSCOPY (EGD) WITH PROPOFOL;  Surgeon: Manya Silvas, MD;  Location: [facility] ENDOSCOPY;  Service: Endoscopy;  Laterality: N/A;  . HAND SURGERY     for arthritis  . UPPER GI ENDOSCOPY      Family History  Problem Relation Age of Onset  . Colon cancer Father   . Heart disease Father   . Breast cancer Neg Hx     Social History Social History  Substance Use Topics  . Smoking status: Current Every Day Smoker    Packs/day: 0.50    Years: 40.00    Types: Cigarettes  . Smokeless tobacco: Never Used     Comment: Hx of 1 PPD from 70 years old until 01/2016  . Alcohol use 8.4 oz/week    14 Glasses of wine per week    No Known Allergies  Current Outpatient Prescriptions  Medication Sig Dispense Refill  . ALPRAZolam (XANAX) 0.25 MG tablet Take 0.5 tablets by mouth 2 (two) times daily as needed.  0  . Biotin 1 MG CAPS Take 1 tablet by mouth daily.    . calcium carbonate (TUMS - DOSED IN MG ELEMENTAL CALCIUM) 500 MG chewable tablet Chew 1 tablet by mouth daily.    Marland Kitchen escitalopram (LEXAPRO) 10 MG tablet Take 10 mg by mouth daily.    . Ferrous Fumarate-Folic Acid (HEMATINIC/FOLIC ACID) 99991111 MG TABS Take 1 tablet by mouth daily.    Marland Kitchen levothyroxine (SYNTHROID,  LEVOTHROID) 50 MCG tablet Take 50 mcg by mouth daily before breakfast.    . losartan (COZAAR) 50 MG tablet Take 50 mg by mouth daily.    . mirtazapine (REMERON) 30 MG tablet Take 30 mg by mouth at bedtime.    . Multiple Vitamin (MULTIVITAMIN) capsule Take 1 capsule by mouth daily.    Marland Kitchen omeprazole (PRILOSEC) 40 MG capsule Take 40 mg by mouth daily.     No current facility-administered medications for this visit.       Review of Systems A complete review of systems was asked and was negative except for the following positive findingsWeight loss, cough, joint pain, depression, easy bruising.  Blood pressure (!) 205/97, pulse 75, temperature 98.6 F (37 C), temperature source Oral, resp. rate 18, height (P) 5\' 4"   (1.626 m), weight 107 lb (48.5 kg), SpO2 98 %.  Physical Exam CONSTITUTIONAL:  Pleasant, well-developed, well-nourished, and in no acute distress. EYES: Pupils equal and reactive to light, Sclera non-icteric EARS, NOSE, MOUTH AND THROAT:  The oropharynx was clear.  Dentition is good repair.  Oral mucosa pink and moist. LYMPH NODES:  Lymph nodes in the neck and axillae were normal RESPIRATORY:  Lungs were clear.  Normal respiratory effort without pathologic use of accessory muscles of respiration CARDIOVASCULAR: Heart was regular without murmurs.  There were no carotid bruits. GI: The abdomen was soft, nontender, and nondistended. There were no palpable masses. There was no hepatosplenomegaly. There were normal bowel sounds in all quadrants. GU:  Rectal deferred.   MUSCULOSKELETAL:  Normal muscle strength and tone.  No clubbing or cyanosis.   SKIN:  There were no pathologic skin lesions.  There were no nodules on palpation. NEUROLOGIC:  Sensation is normal.  Cranial nerves are grossly intact. PSYCH:  Oriented to person, place and time.  Mood and affect are normal.  Data Reviewed CT scan and PET scan  I have personally reviewed the patient's imaging, laboratory findings and medical records.    Assessment    I have independently reviewed the patient's CT scan and PET scan. There is an irregular left upper lobe mass which would be consistent with a malignancy or infection. The biopsy did not reveal any evidence of carcinoma.    Plan    I discussed with her the possibilities. I believe that I would recommend a repeat CT scan and a proximally 6 weeks. This will allow [place] to better assess the left upper lobe mass. We will do this with contrast as well. She is agreeable to that. We will see her back at that time.       Nestor Lewandowsky, MD 05/01/2016, 12:00 PM

## 2016-05-01 NOTE — Patient Instructions (Addendum)
We will have you return to clinic in 6 weeks  with a CT scan prior to your appointment. Please see your appointment information below.

## 2016-06-19 ENCOUNTER — Ambulatory Visit: Payer: Self-pay | Admitting: Cardiothoracic Surgery

## 2016-06-19 ENCOUNTER — Ambulatory Visit
Admission: RE | Admit: 2016-06-19 | Discharge: 2016-06-19 | Disposition: A | Discharge status: Home / Self Care | Payer: PPO | Source: Ambulatory Visit | Attending: Cardiothoracic Surgery | Admitting: Cardiothoracic Surgery

## 2016-06-19 ENCOUNTER — Ambulatory Visit: Payer: PPO | Admitting: Cardiothoracic Surgery

## 2016-06-19 DIAGNOSIS — R911 Solitary pulmonary nodule: Secondary | ICD-10-CM | POA: Diagnosis not present

## 2016-06-19 DIAGNOSIS — J439 Emphysema, unspecified: Secondary | ICD-10-CM | POA: Insufficient documentation

## 2016-06-19 DIAGNOSIS — I2510 Atherosclerotic heart disease of native coronary artery without angina pectoris: Secondary | ICD-10-CM | POA: Insufficient documentation

## 2016-06-19 DIAGNOSIS — I700 Atherosclerosis of aorta: Secondary | ICD-10-CM | POA: Insufficient documentation

## 2016-06-19 LAB — POCT I-STAT CREATININE: Creatinine, Ser: 0.6 mg/dL (ref 0.44–1.00)

## 2016-06-19 MED ORDER — IOPAMIDOL (ISOVUE-300) INJECTION 61%
60.0000 mL | Freq: Once | INTRAVENOUS | Status: AC | PRN
  Administered 2016-06-19: 60 mL via INTRAVENOUS

## 2016-06-25 ENCOUNTER — Encounter: Payer: Self-pay | Admitting: Cardiothoracic Surgery

## 2016-06-25 ENCOUNTER — Ambulatory Visit: Payer: Self-pay | Admitting: Cardiothoracic Surgery

## 2016-06-25 ENCOUNTER — Ambulatory Visit (INDEPENDENT_AMBULATORY_CARE_PROVIDER_SITE_OTHER): Payer: PPO | Admitting: Cardiothoracic Surgery

## 2016-06-25 VITALS — BP 182/94 | HR 78 | Temp 97.5°F | Wt 108.0 lb

## 2016-06-25 DIAGNOSIS — R918 Other nonspecific abnormal finding of lung field: Secondary | ICD-10-CM

## 2016-06-25 DIAGNOSIS — D509 Iron deficiency anemia, unspecified: Secondary | ICD-10-CM | POA: Diagnosis not present

## 2016-06-25 DIAGNOSIS — E039 Hypothyroidism, unspecified: Secondary | ICD-10-CM | POA: Diagnosis not present

## 2016-06-25 DIAGNOSIS — Z0001 Encounter for general adult medical examination with abnormal findings: Secondary | ICD-10-CM | POA: Diagnosis not present

## 2016-06-25 DIAGNOSIS — E559 Vitamin D deficiency, unspecified: Secondary | ICD-10-CM | POA: Diagnosis not present

## 2016-06-25 DIAGNOSIS — E782 Mixed hyperlipidemia: Secondary | ICD-10-CM | POA: Diagnosis not present

## 2016-06-25 NOTE — Progress Notes (Signed)
  Patient ID: Dawn Carpenter., female   DOB: 1945-07-30, 70 y.o.   MRN: [id number]  HISTORY: This patient comes in for continued follow-up. 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 There is no evidence of malignancy. We continue to follow this and had a repeat CT scan of the chest performed. The patient states that she's been getting along quite well. She does not complain of any significant shortness of breath. She did have some pulmonary function studies done several months ago with a normal FEV1 but a markedly diminished DLCO. Unfortunately she does continue to smoke at least half a pack cigarettes a day.   Vitals:   06/25/16 1053  BP: (!) 182/94  Pulse: 78  Temp: 97.5 F (36.4 C)     EXAM:    Resp: Lungs are clear bilaterally.  No respiratory distress, normal effort. Heart:  Regular without murmurs Abd:  Abdomen is soft, non distended and non tender. No masses are palpable.  There is no rebound and no guarding.  Neurological: Alert and oriented to person, place, and time. Coordination normal.  Skin: Skin is warm and dry. No rash noted. No diaphoretic. No erythema. No pallor.  Psychiatric: Normal mood and affect. Normal behavior. Judgment and thought content normal.    ASSESSMENT: I have independently reviewed her CT scan. I discussed these findings with the patient and her daughter. I reviewed them with them as well. The CT scan shows no change in the left upper lobe nodule. This is very concerning for malignancy despite a negative biopsy.   PLAN:   I had a long discussion with the patient regarding the options. I told her that surgical resection would be the definitive diagnostic and therapeutic treatment. However she is reluctant to pursue that. She would like to just pursue a nonoperative approach. I've asked her to contact Dr. Tyson Dense office for her continued follow-up with him.    Nestor Lewandowsky, MD

## 2016-06-25 NOTE — Patient Instructions (Signed)
Please give us a call if you have any questions or concerns. 

## 2016-07-03 ENCOUNTER — Ambulatory Visit: Payer: Self-pay | Admitting: Cardiothoracic Surgery

## 2016-07-10 ENCOUNTER — Encounter: Payer: Self-pay | Admitting: Cardiothoracic Surgery

## 2016-07-22 DIAGNOSIS — R911 Solitary pulmonary nodule: Secondary | ICD-10-CM | POA: Diagnosis not present

## 2016-07-22 DIAGNOSIS — E039 Hypothyroidism, unspecified: Secondary | ICD-10-CM | POA: Diagnosis not present

## 2016-07-22 DIAGNOSIS — K580 Irritable bowel syndrome with diarrhea: Secondary | ICD-10-CM | POA: Diagnosis not present

## 2016-07-22 DIAGNOSIS — F333 Major depressive disorder, recurrent, severe with psychotic symptoms: Secondary | ICD-10-CM | POA: Diagnosis not present

## 2016-07-22 DIAGNOSIS — D509 Iron deficiency anemia, unspecified: Secondary | ICD-10-CM | POA: Diagnosis not present

## 2016-07-22 DIAGNOSIS — F17210 Nicotine dependence, cigarettes, uncomplicated: Secondary | ICD-10-CM | POA: Diagnosis not present

## 2016-07-22 DIAGNOSIS — I10 Essential (primary) hypertension: Secondary | ICD-10-CM | POA: Diagnosis not present

## 2016-09-09 ENCOUNTER — Other Ambulatory Visit: Payer: Self-pay | Admitting: Nurse Practitioner

## 2016-09-09 DIAGNOSIS — R911 Solitary pulmonary nodule: Secondary | ICD-10-CM

## 2016-09-30 ENCOUNTER — Ambulatory Visit
Admission: RE | Admit: 2016-09-30 | Discharge: 2016-09-30 | Disposition: A | Discharge status: Home / Self Care | Payer: PPO | Source: Ambulatory Visit | Attending: Nurse Practitioner | Admitting: Nurse Practitioner

## 2016-09-30 DIAGNOSIS — R918 Other nonspecific abnormal finding of lung field: Secondary | ICD-10-CM | POA: Insufficient documentation

## 2016-09-30 DIAGNOSIS — R911 Solitary pulmonary nodule: Secondary | ICD-10-CM | POA: Diagnosis not present

## 2016-09-30 DIAGNOSIS — I700 Atherosclerosis of aorta: Secondary | ICD-10-CM | POA: Insufficient documentation

## 2016-09-30 LAB — POCT I-STAT CREATININE: CREATININE: 0.5 mg/dL (ref 0.44–1.00)

## 2016-09-30 MED ORDER — IOPAMIDOL (ISOVUE-300) INJECTION 61%
75.0000 mL | Freq: Once | INTRAVENOUS | Status: AC | PRN
  Administered 2016-09-30: 75 mL via INTRAVENOUS

## 2017-01-05 DIAGNOSIS — H353133 Nonexudative age-related macular degeneration, bilateral, advanced atrophic without subfoveal involvement: Secondary | ICD-10-CM | POA: Diagnosis not present

## 2017-01-05 DIAGNOSIS — H25013 Cortical age-related cataract, bilateral: Secondary | ICD-10-CM | POA: Diagnosis not present

## 2017-01-05 DIAGNOSIS — H524 Presbyopia: Secondary | ICD-10-CM | POA: Diagnosis not present

## 2017-01-05 DIAGNOSIS — H52223 Regular astigmatism, bilateral: Secondary | ICD-10-CM | POA: Diagnosis not present

## 2017-01-05 DIAGNOSIS — H40023 Open angle with borderline findings, high risk, bilateral: Secondary | ICD-10-CM | POA: Diagnosis not present

## 2017-01-05 DIAGNOSIS — H2513 Age-related nuclear cataract, bilateral: Secondary | ICD-10-CM | POA: Diagnosis not present

## 2017-01-09 IMAGING — CT CT BIOPSY
1 of 3 series · 12 of 32 positions shown, 18 images · non-contrast
Comparison: none

CLINICAL DATA: 1.7 cm left upper lobe lung nodule. The patient
presents for biopsy.

[Series 2: i-spiral 5.0 b31f · axial · 0.66mm/px · z∈[+1211,+1365]mm · 12 of 52 slices shown, 18 images]
[im 4/52  soft-tissue]
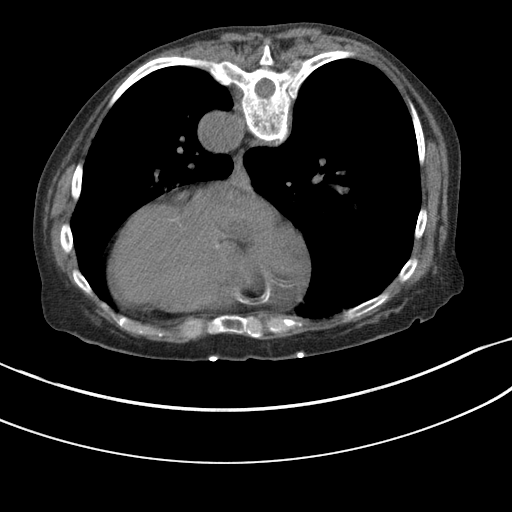
[im 4/52  bone]
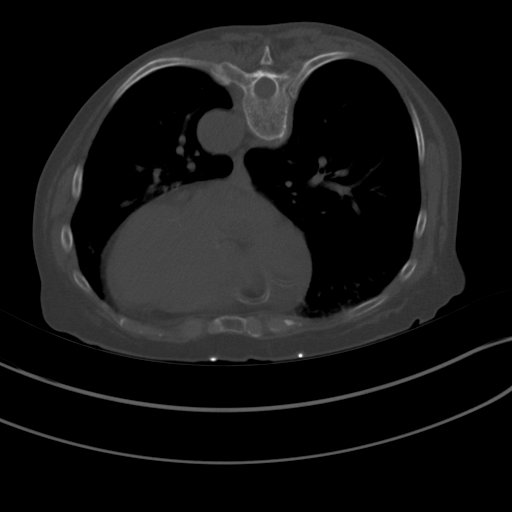
[im 8/52  soft-tissue]
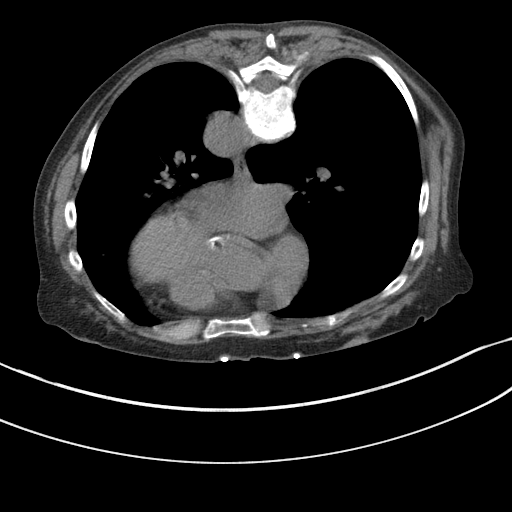
[im 12/52  soft-tissue]
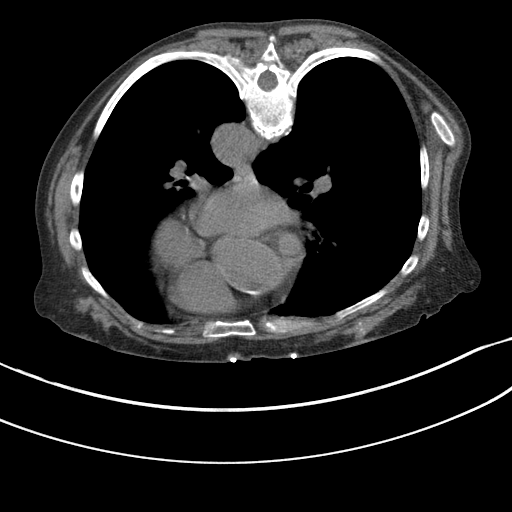
[im 16/52  soft-tissue]
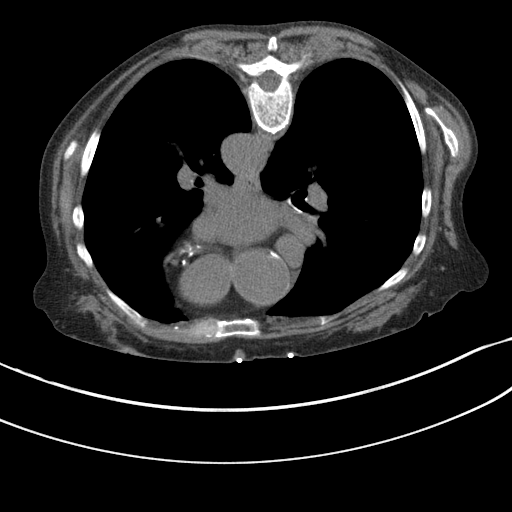
[im 20/52  soft-tissue]
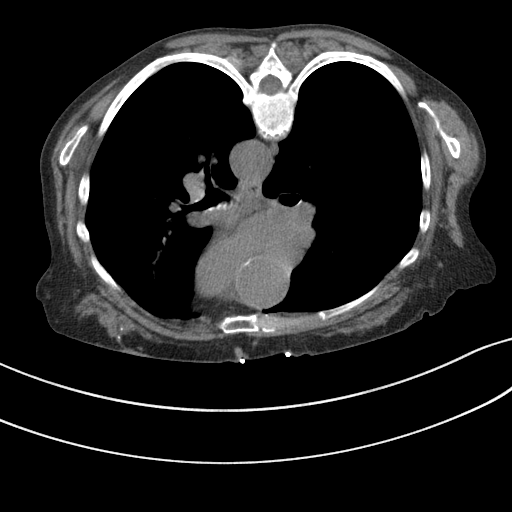
[im 24/52  soft-tissue]
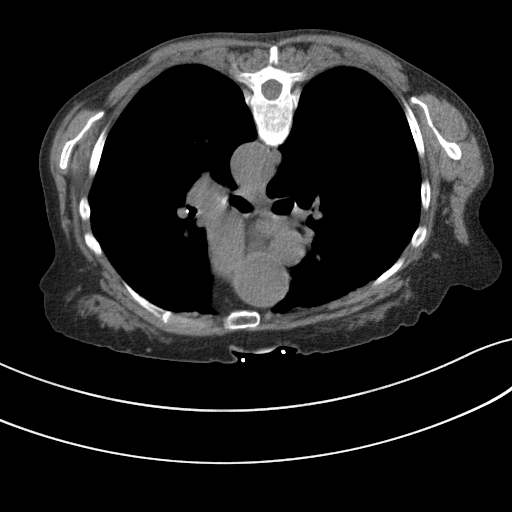
[im 28/52  soft-tissue]
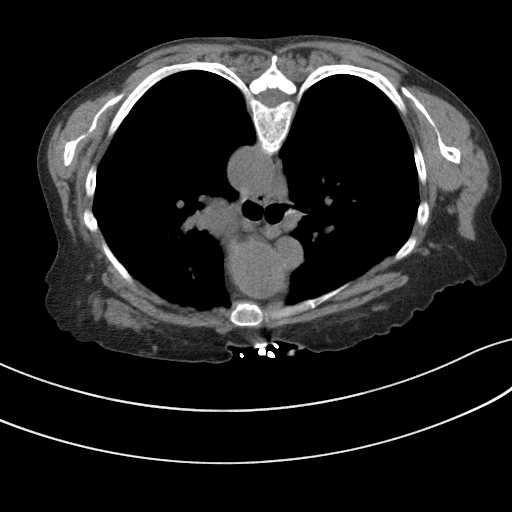
[im 32/52  soft-tissue]
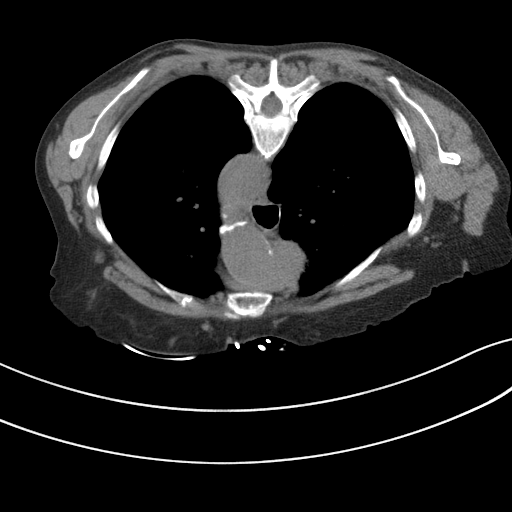
[im 36/52  soft-tissue]
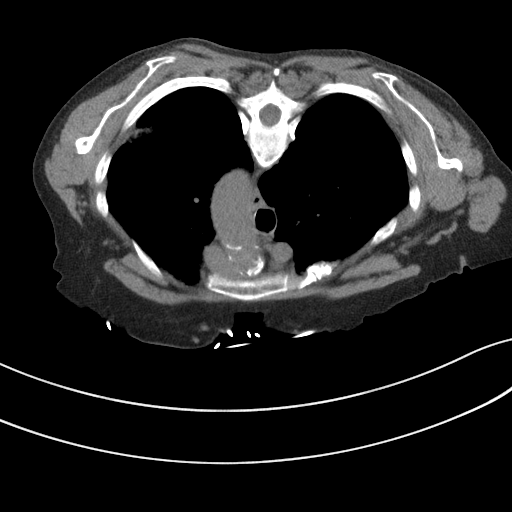
[im 36/52  lung]
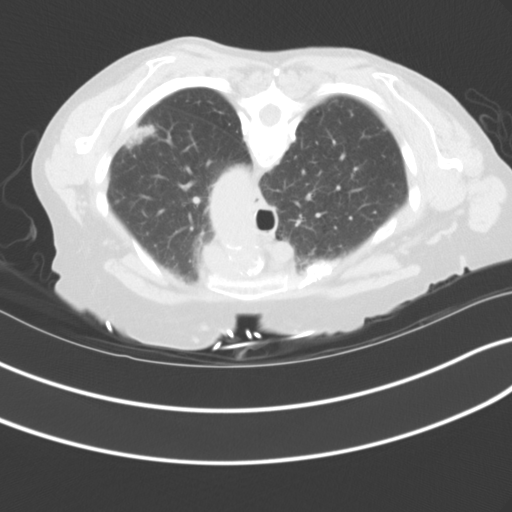
[im 36/52  bone]
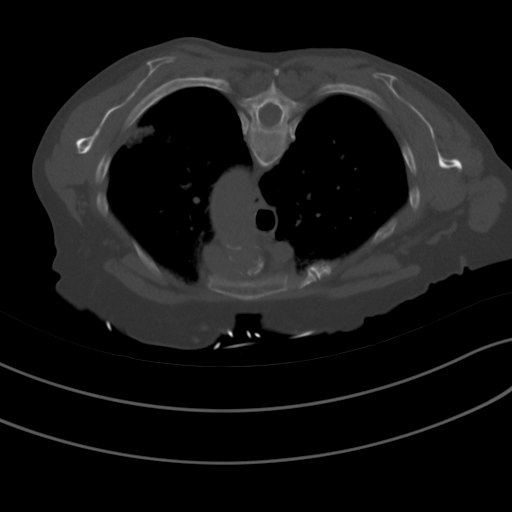
[im 40/52  soft-tissue]
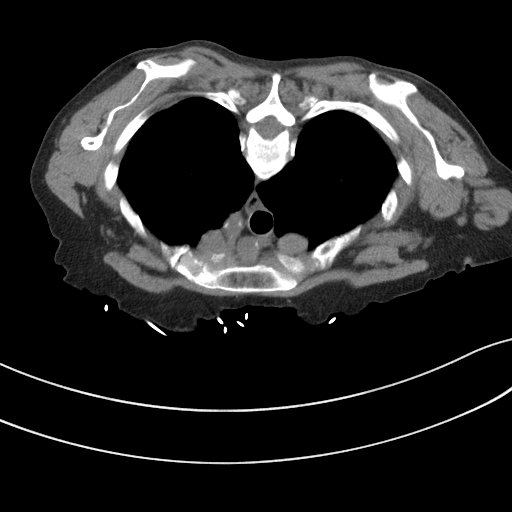
[im 40/52  lung]
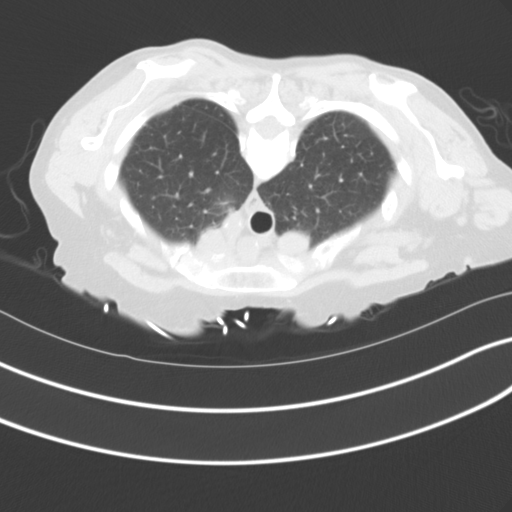
[im 44/52  soft-tissue]
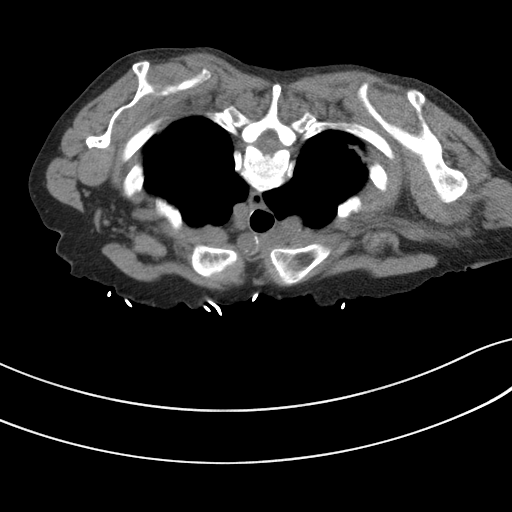
[im 44/52  lung]
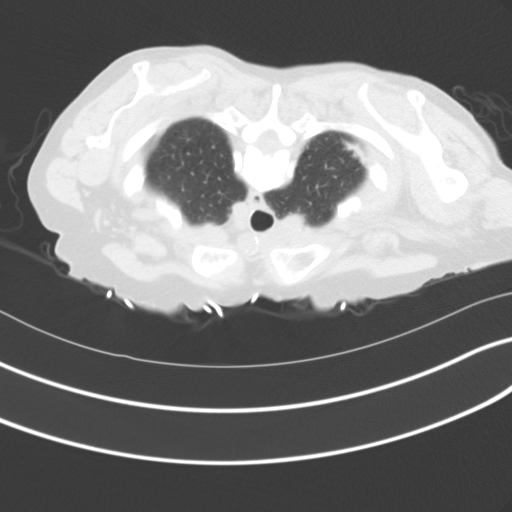
[im 48/52  soft-tissue]
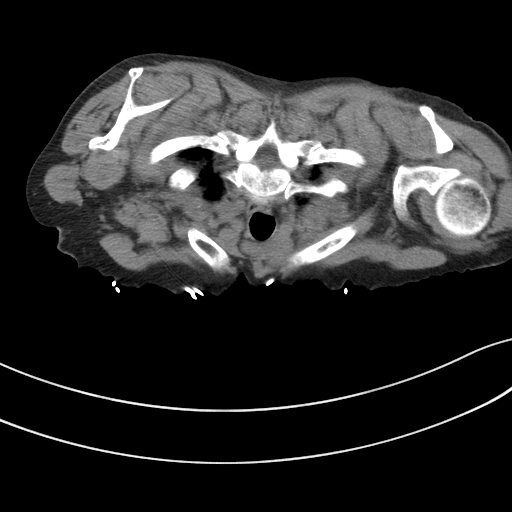
[im 48/52  lung]
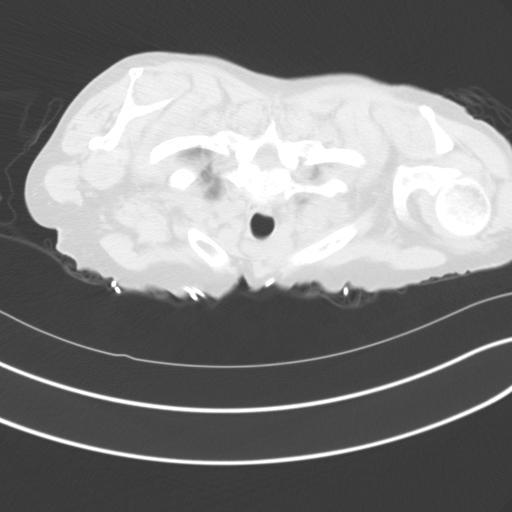

[12 of 32 positions shown; findings below may reference images not displayed]

EXAM:
CT GUIDED CORE BIOPSY OF LUNG

ANESTHESIA/SEDATION:
2.5 mg IV Versed; 75 mcg IV Fentanyl

Total Moderate Sedation Time:  38 minutes.

The patient's level of consciousness and physiologic status were
continuously monitored during the procedure by Radiology nursing.

PROCEDURE:
The procedure risks, benefits, and alternatives were explained to
the patient. Questions regarding the procedure were encouraged and
answered. The patient understands and consents to the procedure. A
time-out was performed prior to initiating the procedure.

The left chest wall was prepped with chlorhexidine in a sterile
fashion, and a sterile drape was applied covering the operative
field. A sterile gown and sterile gloves were used for the
procedure. Local anesthesia was provided with 1% Lidocaine.

The patient was placed in a prone position. CT imaging was performed
through the chest. Under CT guidance, a 17 gauge trocar needle was
advanced to the level of a left upper lobe lung mass. After
confirming needle tip position, 2 separate coaxial 18 gauge core
biopsy samples were obtained. Additional CT was performed. Some air
was aspirated from the pleural space via the 17 gauge needle and the
needle was retracted.

COMPLICATIONS:
Tiny left pneumothorax.  SIR level A: No therapy, no consequence.
FINDINGS: Initial CT demonstrates the posterolateral and subpleural left upper
lobe lung mass measuring on today's images nearly 2 cm in greatest
diameter. Solid tissue was obtained from the lesion. There was a
tiny pneumothorax present upon completion of biopsy. Air was
aspirated from the pleural space after obtaining biopsies with a
minimal crescent of air remaining adjacent to the lesion on
completion. This will be followed with a follow-up chest x-ray 2
hours after the biopsy procedure.
IMPRESSION: CT-guided core biopsy performed of left upper lobe lung mass. The
procedure was complicated by a very small left pneumothorax which
was mostly aspirated via a needle upon completion of the procedure.
The small pneumothorax will be followed with a follow-up chest x-ray
2 hours after the biopsy procedure.

## 2017-01-19 DIAGNOSIS — H353134 Nonexudative age-related macular degeneration, bilateral, advanced atrophic with subfoveal involvement: Secondary | ICD-10-CM | POA: Diagnosis not present

## 2017-01-25 DIAGNOSIS — F333 Major depressive disorder, recurrent, severe with psychotic symptoms: Secondary | ICD-10-CM | POA: Diagnosis not present

## 2017-01-25 DIAGNOSIS — E039 Hypothyroidism, unspecified: Secondary | ICD-10-CM | POA: Diagnosis not present

## 2017-01-25 DIAGNOSIS — F17210 Nicotine dependence, cigarettes, uncomplicated: Secondary | ICD-10-CM | POA: Diagnosis not present

## 2017-01-25 DIAGNOSIS — I10 Essential (primary) hypertension: Secondary | ICD-10-CM | POA: Diagnosis not present

## 2017-01-25 DIAGNOSIS — K580 Irritable bowel syndrome with diarrhea: Secondary | ICD-10-CM | POA: Diagnosis not present

## 2017-01-25 DIAGNOSIS — R911 Solitary pulmonary nodule: Secondary | ICD-10-CM | POA: Diagnosis not present

## 2017-01-25 DIAGNOSIS — Z0001 Encounter for general adult medical examination with abnormal findings: Secondary | ICD-10-CM | POA: Diagnosis not present

## 2017-01-25 DIAGNOSIS — D509 Iron deficiency anemia, unspecified: Secondary | ICD-10-CM | POA: Diagnosis not present

## 2017-02-03 DIAGNOSIS — H2513 Age-related nuclear cataract, bilateral: Secondary | ICD-10-CM | POA: Diagnosis not present

## 2017-02-04 ENCOUNTER — Other Ambulatory Visit: Payer: Self-pay | Admitting: Nurse Practitioner

## 2017-02-04 DIAGNOSIS — Z1231 Encounter for screening mammogram for malignant neoplasm of breast: Secondary | ICD-10-CM

## 2017-02-04 DIAGNOSIS — M810 Age-related osteoporosis without current pathological fracture: Secondary | ICD-10-CM

## 2017-03-01 ENCOUNTER — Other Ambulatory Visit: Payer: Self-pay | Admitting: Internal Medicine

## 2017-03-01 DIAGNOSIS — R911 Solitary pulmonary nodule: Secondary | ICD-10-CM

## 2017-03-17 ENCOUNTER — Ambulatory Visit: Payer: PPO

## 2017-04-06 ENCOUNTER — Other Ambulatory Visit: Payer: PPO

## 2017-04-07 ENCOUNTER — Ambulatory Visit: Payer: PPO

## 2017-04-20 ENCOUNTER — Ambulatory Visit
Admission: RE | Admit: 2017-04-20 | Discharge: 2017-04-20 | Disposition: A | Discharge status: Home / Self Care | Payer: PPO | Source: Ambulatory Visit | Attending: Internal Medicine | Admitting: Internal Medicine

## 2017-05-12 ENCOUNTER — Other Ambulatory Visit: Payer: PPO

## 2017-07-26 ENCOUNTER — Ambulatory Visit: Payer: Self-pay | Admitting: Nurse Practitioner

## 2017-07-29 ENCOUNTER — Ambulatory Visit: Payer: Self-pay | Admitting: Nurse Practitioner

## 2017-08-06 ENCOUNTER — Other Ambulatory Visit: Payer: Self-pay

## 2017-08-06 ENCOUNTER — Ambulatory Visit: Payer: Self-pay | Admitting: Nurse Practitioner

## 2017-08-06 MED ORDER — LOSARTAN POTASSIUM 50 MG PO TABS: 50.0000 mg | ORAL_TABLET | Freq: Every day | ORAL | 0 refills | Status: DC

## 2017-08-06 MED ORDER — ESCITALOPRAM OXALATE 10 MG PO TABS: 10.0000 mg | ORAL_TABLET | Freq: Every day | ORAL | 0 refills | Status: DC

## 2017-08-06 MED ORDER — MIRTAZAPINE 30 MG PO TABS: 30.0000 mg | ORAL_TABLET | Freq: Every day | ORAL | 0 refills | Status: DC

## 2017-08-11 ENCOUNTER — Telehealth: Payer: Self-pay

## 2017-08-11 ENCOUNTER — Other Ambulatory Visit: Payer: Self-pay

## 2017-08-11 MED ORDER — ALPRAZOLAM 0.25 MG PO TABS: 0.1250 mg | ORAL_TABLET | Freq: Two times a day (BID) | ORAL | 2 refills | Status: DC | PRN

## 2017-08-11 NOTE — Telephone Encounter (Signed)
-----   Message from Ronnell Freshwater, NP sent at 08/11/2017  9:15 AM EST ----- Regarding: FW: pt requesting refill on xanax Hey. Do you mind calling this in for her. I am still unable to e-send controlled substances. Thanks  ----- Message ----- From: Edd Arbour, CMA Sent: 08/06/2017  12:13 PM To: Ronnell Freshwater, NP Subject: pt requesting refill on xanax                  Pt caleed asking for refills bc her appt was canceled with you for today.  Needs refill on xanax.  I sent in other refills already.  dbs

## 2017-08-11 NOTE — Telephone Encounter (Signed)
CALLED IN PHAR AND LMOM FOR PT

## 2017-09-09 ENCOUNTER — Other Ambulatory Visit: Payer: Self-pay

## 2017-09-09 MED ORDER — MIRTAZAPINE 30 MG PO TABS: 30.0000 mg | ORAL_TABLET | Freq: Every day | ORAL | 5 refills | Status: DC

## 2017-09-09 MED ORDER — LOSARTAN POTASSIUM 50 MG PO TABS: 50.0000 mg | ORAL_TABLET | Freq: Every day | ORAL | 5 refills | Status: DC

## 2017-09-17 ENCOUNTER — Ambulatory Visit (INDEPENDENT_AMBULATORY_CARE_PROVIDER_SITE_OTHER): Payer: PPO | Admitting: Nurse Practitioner

## 2017-09-17 ENCOUNTER — Encounter: Payer: Self-pay | Admitting: Nurse Practitioner

## 2017-09-17 VITALS — BP 122/78 | HR 86 | Resp 16 | Ht 65.0 in | Wt 103.8 lb

## 2017-09-17 DIAGNOSIS — I10 Essential (primary) hypertension: Secondary | ICD-10-CM | POA: Diagnosis not present

## 2017-09-17 DIAGNOSIS — K219 Gastro-esophageal reflux disease without esophagitis: Secondary | ICD-10-CM

## 2017-09-17 DIAGNOSIS — F411 Generalized anxiety disorder: Secondary | ICD-10-CM

## 2017-09-17 DIAGNOSIS — F321 Major depressive disorder, single episode, moderate: Secondary | ICD-10-CM | POA: Diagnosis not present

## 2017-09-17 DIAGNOSIS — E039 Hypothyroidism, unspecified: Secondary | ICD-10-CM

## 2017-09-17 MED ORDER — ESCITALOPRAM OXALATE 10 MG PO TABS: 10.0000 mg | ORAL_TABLET | Freq: Two times a day (BID) | ORAL | 3 refills | Status: DC

## 2017-09-17 MED ORDER — ALPRAZOLAM 0.25 MG PO TABS: 0.1250 mg | ORAL_TABLET | Freq: Two times a day (BID) | ORAL | 2 refills | Status: DC | PRN

## 2017-09-17 MED ORDER — LOSARTAN POTASSIUM 50 MG PO TABS: 50.0000 mg | ORAL_TABLET | Freq: Every day | ORAL | 5 refills | Status: DC

## 2017-09-17 MED ORDER — LEVOTHYROXINE SODIUM 50 MCG PO TABS: 50.0000 ug | ORAL_TABLET | Freq: Every day | ORAL | 3 refills | Status: DC

## 2017-09-17 MED ORDER — MIRTAZAPINE 30 MG PO TABS: 30.0000 mg | ORAL_TABLET | Freq: Every day | ORAL | 5 refills | Status: DC

## 2017-09-17 MED ORDER — OMEPRAZOLE 40 MG PO CPDR: 40.0000 mg | DELAYED_RELEASE_CAPSULE | Freq: Every day | ORAL | 3 refills | Status: DC

## 2017-09-17 NOTE — Progress Notes (Signed)
[facility] [place], [place] [address]  Internal MEDICINE  Office Visit Note  Patient Name: Dawn Carpenter  [id number]  [id number]  Date of Service: 10/06/2017  No chief complaint on file.   The patient is here for routine follow up exam. She has stable history of depression and anxiety. Well controlled with current medications. She also has chronic restless legs syndrome.  She needs refills for these meds today. She has no new physical concerns or complaints.     Pt is here for routine follow up.    Current Medication: Outpatient Encounter Medications as of 09/17/2017  Medication Sig  . ALPRAZolam (XANAX) 0.25 MG tablet Take 0.5 tablets (0.125 mg total) by mouth 2 (two) times daily as needed.  . Biotin 1 MG CAPS Take 1 tablet by mouth daily.  . calcium carbonate (TUMS - DOSED IN MG ELEMENTAL CALCIUM) 500 MG chewable tablet Chew 1 tablet by mouth daily.  Marland Kitchen escitalopram (LEXAPRO) 10 MG tablet Take 1 tablet (10 mg total) by mouth 2 (two) times daily.  . Ferrous Fumarate-Folic Acid (HEMATINIC/FOLIC ACID) 619-5 MG TABS Take 1 tablet by mouth daily.  Marland Kitchen levothyroxine (SYNTHROID, LEVOTHROID) 50 MCG tablet Take 1 tablet (50 mcg total) by mouth daily before breakfast.  . losartan (COZAAR) 50 MG tablet Take 1 tablet (50 mg total) by mouth daily.  . mirtazapine (REMERON) 30 MG tablet Take 1 tablet (30 mg total) by mouth at bedtime.  . Multiple Vitamin (MULTIVITAMIN) capsule Take 1 capsule by mouth daily.  Marland Kitchen omeprazole (PRILOSEC) 40 MG capsule Take 1 capsule (40 mg total) by mouth daily.  . [DISCONTINUED] ALPRAZolam (XANAX) 0.25 MG tablet Take 0.5 tablets (0.125 mg total) by mouth 2 (two) times daily as needed.  . [DISCONTINUED] ALPRAZolam (XANAX) 0.25 MG tablet Take 0.5 tablets (0.125 mg total) by mouth 2 (two) times daily as needed.  . [DISCONTINUED] escitalopram (LEXAPRO) 10 MG tablet Take 1 tablet (10 mg total) by mouth daily. (Patient taking differently: Take 10  mg by mouth 2 (two) times daily. )  . [DISCONTINUED] levothyroxine (SYNTHROID, LEVOTHROID) 50 MCG tablet Take 50 mcg by mouth daily before breakfast.  . [DISCONTINUED] losartan (COZAAR) 50 MG tablet Take 1 tablet (50 mg total) by mouth daily.  . [DISCONTINUED] mirtazapine (REMERON) 30 MG tablet Take 1 tablet (30 mg total) by mouth at bedtime.  . [DISCONTINUED] omeprazole (PRILOSEC) 40 MG capsule Take 40 mg by mouth daily.  . [DISCONTINUED] dicyclomine (BENTYL) 10 MG capsule Take 10 mg by mouth 2 (two) times daily as needed for spasms.   No facility-administered encounter medications on file as of 09/17/2017.     Surgical History: Past Surgical History:  Procedure Laterality Date  . ABDOMINAL HYSTERECTOMY    . COLONOSCOPY    . COLONOSCOPY WITH PROPOFOL N/A 05/15/2015   Procedure: COLONOSCOPY WITH PROPOFOL;  Surgeon: Manya Silvas, MD;  Location: [facility] ENDOSCOPY;  Service: Endoscopy;  Laterality: N/A;  . ESOPHAGOGASTRODUODENOSCOPY (EGD) WITH PROPOFOL N/A 05/15/2015   Procedure: ESOPHAGOGASTRODUODENOSCOPY (EGD) WITH PROPOFOL;  Surgeon: Manya Silvas, MD;  Location: [facility] ENDOSCOPY;  Service: Endoscopy;  Laterality: N/A;  . HAND SURGERY     for arthritis  . UPPER GI ENDOSCOPY      Medical History: Past Medical History:  Diagnosis Date  . Anemia   . Anxiety   . Arthritis    rheumatoid  . Depression   . GERD (gastroesophageal reflux disease)   . Hemorrhoids   . Hypertension   .  Osteoporosis   . Thyroid disease    hypothyoridism    Family History: Family History  Problem Relation Age of Onset  . Colon cancer Father   . Heart disease Father   . Breast cancer Neg Hx     Social History   Socioeconomic History  . Marital status: Single    Spouse name: Not on file  . Number of children: Not on file  . Years of education: Not on file  . Highest education level: Not on file  Social Needs  . Financial resource strain: Not on file  . Food insecurity - worry: Not on file   . Food insecurity - inability: Not on file  . Transportation needs - medical: Not on file  . Transportation needs - non-medical: Not on file  Occupational History  . Not on file  Tobacco Use  . Smoking status: Current Every Day Smoker    Packs/day: 0.50    Years: 40.00    Pack years: 20.00    Types: Cigarettes  . Smokeless tobacco: Never Used  . Tobacco comment: Hx of 1 PPD from 72 years old until 01/2016  Substance and Sexual Activity  . Alcohol use: No    Frequency: Never  . Drug use: No  . Sexual activity: Not on file  Other Topics Concern  . Not on file  Social History Narrative  . Not on file      Review of Systems  Constitutional: Negative for activity change, chills, fatigue and unexpected weight change.  HENT: Negative for congestion, postnasal drip, rhinorrhea, sneezing and sore throat.   Eyes: Negative.  Negative for redness.  Respiratory: Negative for cough, chest tightness and shortness of breath.   Cardiovascular: Negative for chest pain and palpitations.  Gastrointestinal: Negative for abdominal pain, constipation, diarrhea, nausea and vomiting.  Endocrine:       Well controlled hypothyroid.  Genitourinary: Negative.  Negative for dysuria and frequency.  Musculoskeletal: Negative for arthralgias, back pain, joint swelling and neck pain.  Skin: Negative for rash.  Allergic/Immunologic: Negative for environmental allergies.  Neurological: Negative for tremors, numbness and headaches.       Restless legs  Hematological: Negative for adenopathy. Does not bruise/bleed easily.  Psychiatric/Behavioral: Positive for behavioral problems (Depression). Negative for sleep disturbance and suicidal ideas. The patient is nervous/anxious.     Today's Vitals   09/17/17 1145  BP: 122/78  Pulse: 86  Resp: 16  SpO2: 98%  Weight: 103 lb 12.8 oz (47.1 kg)  Height: 5\' 5"  (1.651 m)    Physical Exam  Constitutional: She is oriented to person, place, and time. She appears  well-developed and well-nourished. No distress.  HENT:  Head: Normocephalic and atraumatic.  Mouth/Throat: Oropharynx is clear and moist. No oropharyngeal exudate.  Eyes: EOM are normal. Pupils are equal, round, and reactive to light.  Neck: Normal range of motion. Neck supple. No JVD present. No tracheal deviation present. No thyromegaly present.  Cardiovascular: Normal rate, regular rhythm and normal heart sounds. Exam reveals no gallop and no friction rub.  No murmur heard. Pulmonary/Chest: Effort normal and breath sounds normal. No respiratory distress. She has no rales. She exhibits no tenderness.  Abdominal: Soft. Bowel sounds are normal. There is no tenderness.  Musculoskeletal: Normal range of motion.  Lymphadenopathy:    She has no cervical adenopathy.  Neurological: She is alert and oriented to person, place, and time. No cranial nerve deficit.  Skin: Skin is warm and dry. She is not diaphoretic.  Psychiatric: She has a normal mood and affect. Her behavior is normal. Judgment and thought content normal.  Nursing note and vitals reviewed.  Assessment/Plan:  1. Gastroesophageal reflux disease without esophagitis - omeprazole (PRILOSEC) 40 MG capsule; Take 1 capsule (40 mg total) by mouth daily.  Dispense: 30 capsule; Refill: 3  2. Essential hypertension Stable. Continue bp medication as prescribed.  - losartan (COZAAR) 50 MG tablet; Take 1 tablet (50 mg total) by mouth daily.  Dispense: 30 tablet; Refill: 5  3. Acquired hypothyroidism Recheck thyroid levels and adjust levothyroxine as indicated.  - levothyroxine (SYNTHROID, LEVOTHROID) 50 MCG tablet; Take 1 tablet (50 mcg total) by mouth daily before breakfast.  Dispense: 30 tablet; Refill: 3  4. Moderate major depression (HCC) Continue lexapro and remeron as precribed.  - escitalopram (LEXAPRO) 10 MG tablet; Take 1 tablet (10 mg total) by mouth 2 (two) times daily.  Dispense: 60 tablet; Refill: 3 - mirtazapine (REMERON) 30  MG tablet; Take 1 tablet (30 mg total) by mouth at bedtime.  Dispense: 30 tablet; Refill: 5  5. Generalized anxiety disorder May continue alprazolam 0.25mg  as needed and as prescribed.  - ALPRAZolam (XANAX) 0.25 MG tablet; Take 0.5 tablets (0.125 mg total) by mouth 2 (two) times daily as needed.  Dispense: 30 tablet; Refill: 2   General Counseling: doshie maggi understanding of the findings of todays visit and agrees with plan of treatment. I have discussed any further diagnostic evaluation that may be needed or ordered today. We also reviewed her medications today. she has been encouraged to call the office with any questions or concerns that should arise related to todays visit.   This patient was seen by Leretha Pol, FNP- C in Collaboration with Dr Lavera Guise as a part of collaborative care agreement    Meds ordered this encounter  Medications  . DISCONTD: ALPRAZolam (XANAX) 0.25 MG tablet    Sig: Take 0.5 tablets (0.125 mg total) by mouth 2 (two) times daily as needed.    Dispense:  30 tablet    Refill:  2    Order Specific Question:   Supervising Provider    Answer:   Lavera Guise [[id number]]  . escitalopram (LEXAPRO) 10 MG tablet    Sig: Take 1 tablet (10 mg total) by mouth 2 (two) times daily.    Dispense:  60 tablet    Refill:  3    Order Specific Question:   Supervising Provider    Answer:   Lavera Guise [[id number]]  . levothyroxine (SYNTHROID, LEVOTHROID) 50 MCG tablet    Sig: Take 1 tablet (50 mcg total) by mouth daily before breakfast.    Dispense:  30 tablet    Refill:  3    Order Specific Question:   Supervising Provider    Answer:   Lavera Guise [place]  . losartan (COZAAR) 50 MG tablet    Sig: Take 1 tablet (50 mg total) by mouth daily.    Dispense:  30 tablet    Refill:  5    Order Specific Question:   Supervising Provider    Answer:   Lavera Guise [[id number]]  . mirtazapine (REMERON) 30 MG tablet    Sig: Take 1 tablet (30 mg total) by mouth at bedtime.     Dispense:  30 tablet    Refill:  5    Order Specific Question:   Supervising Provider    Answer:   Lavera Guise [[id number]]  . omeprazole (PRILOSEC) 40  MG capsule    Sig: Take 1 capsule (40 mg total) by mouth daily.    Dispense:  30 capsule    Refill:  3    Order Specific Question:   Supervising Provider    Answer:   Lavera Guise [[id number]]  . ALPRAZolam (XANAX) 0.25 MG tablet    Sig: Take 0.5 tablets (0.125 mg total) by mouth 2 (two) times daily as needed.    Dispense:  30 tablet    Refill:  2    Order Specific Question:   Supervising Provider    Answer:   Lavera Guise [[id number]]    Time spent: 68 Minutes    Dr Lavera Guise Internal medicine

## 2017-10-06 DIAGNOSIS — I10 Essential (primary) hypertension: Secondary | ICD-10-CM | POA: Insufficient documentation

## 2017-10-06 DIAGNOSIS — F411 Generalized anxiety disorder: Secondary | ICD-10-CM | POA: Insufficient documentation

## 2017-10-06 DIAGNOSIS — F321 Major depressive disorder, single episode, moderate: Secondary | ICD-10-CM | POA: Insufficient documentation

## 2017-10-06 DIAGNOSIS — E039 Hypothyroidism, unspecified: Secondary | ICD-10-CM | POA: Insufficient documentation

## 2017-10-14 ENCOUNTER — Telehealth: Payer: Self-pay | Admitting: *Deleted

## 2017-10-14 NOTE — Telephone Encounter (Signed)
Left message for patient to notify them that it is time to schedule annual low dose lung cancer screening CT scan. Instructed patient to call back to verify information prior to the scan being scheduled.  

## 2017-11-04 ENCOUNTER — Encounter: Payer: Self-pay | Admitting: *Deleted

## 2018-02-18 ENCOUNTER — Other Ambulatory Visit: Payer: Self-pay

## 2018-02-18 DIAGNOSIS — F321 Major depressive disorder, single episode, moderate: Secondary | ICD-10-CM

## 2018-02-18 MED ORDER — ESCITALOPRAM OXALATE 10 MG PO TABS: 10.0000 mg | ORAL_TABLET | Freq: Two times a day (BID) | ORAL | 3 refills | Status: DC

## 2018-03-25 ENCOUNTER — Encounter: Payer: Self-pay | Admitting: Nurse Practitioner

## 2018-04-20 ENCOUNTER — Ambulatory Visit: Payer: Self-pay | Admitting: Nurse Practitioner

## 2018-05-03 ENCOUNTER — Other Ambulatory Visit: Payer: Self-pay

## 2018-05-03 DIAGNOSIS — K219 Gastro-esophageal reflux disease without esophagitis: Secondary | ICD-10-CM

## 2018-05-03 MED ORDER — OMEPRAZOLE 40 MG PO CPDR: 40.0000 mg | DELAYED_RELEASE_CAPSULE | Freq: Every day | ORAL | 3 refills | Status: DC

## 2018-05-04 ENCOUNTER — Other Ambulatory Visit: Payer: Self-pay | Admitting: Nurse Practitioner

## 2018-05-04 DIAGNOSIS — F411 Generalized anxiety disorder: Secondary | ICD-10-CM

## 2018-05-04 MED ORDER — ALPRAZOLAM 0.25 MG PO TABS: 0.1250 mg | ORAL_TABLET | Freq: Two times a day (BID) | ORAL | 0 refills | Status: DC | PRN

## 2018-05-04 NOTE — Progress Notes (Signed)
Filled rx for alprazolam per pharmacy request. Sent #[phone] with no refills. Patient to be seen 05/17/2018

## 2018-05-17 ENCOUNTER — Encounter: Payer: Self-pay | Admitting: Nurse Practitioner

## 2018-05-17 ENCOUNTER — Ambulatory Visit (INDEPENDENT_AMBULATORY_CARE_PROVIDER_SITE_OTHER): Payer: PPO | Admitting: Nurse Practitioner

## 2018-05-17 VITALS — BP 153/84 | HR 116 | Resp 16 | Ht 64.0 in | Wt 99.8 lb

## 2018-05-17 DIAGNOSIS — E039 Hypothyroidism, unspecified: Secondary | ICD-10-CM | POA: Diagnosis not present

## 2018-05-17 DIAGNOSIS — F321 Major depressive disorder, single episode, moderate: Secondary | ICD-10-CM

## 2018-05-17 DIAGNOSIS — F411 Generalized anxiety disorder: Secondary | ICD-10-CM | POA: Diagnosis not present

## 2018-05-17 DIAGNOSIS — Z1239 Encounter for other screening for malignant neoplasm of breast: Secondary | ICD-10-CM | POA: Diagnosis not present

## 2018-05-17 DIAGNOSIS — I10 Essential (primary) hypertension: Secondary | ICD-10-CM

## 2018-05-17 DIAGNOSIS — K219 Gastro-esophageal reflux disease without esophagitis: Secondary | ICD-10-CM | POA: Diagnosis not present

## 2018-05-17 DIAGNOSIS — Z23 Encounter for immunization: Secondary | ICD-10-CM | POA: Diagnosis not present

## 2018-05-17 DIAGNOSIS — Z0001 Encounter for general adult medical examination with abnormal findings: Secondary | ICD-10-CM

## 2018-05-17 DIAGNOSIS — R300 Dysuria: Secondary | ICD-10-CM

## 2018-05-17 MED ORDER — MIRTAZAPINE 30 MG PO TABS: 30.0000 mg | ORAL_TABLET | Freq: Every day | ORAL | 5 refills | Status: DC

## 2018-05-17 MED ORDER — ESCITALOPRAM OXALATE 10 MG PO TABS: 10.0000 mg | ORAL_TABLET | Freq: Two times a day (BID) | ORAL | 5 refills | Status: DC

## 2018-05-17 MED ORDER — ALPRAZOLAM 0.25 MG PO TABS: 0.1250 mg | ORAL_TABLET | Freq: Two times a day (BID) | ORAL | 3 refills | Status: DC | PRN

## 2018-05-17 MED ORDER — LEVOTHYROXINE SODIUM 50 MCG PO TABS: 50.0000 ug | ORAL_TABLET | Freq: Every day | ORAL | 5 refills | Status: DC

## 2018-05-17 MED ORDER — OMEPRAZOLE 40 MG PO CPDR: 40.0000 mg | DELAYED_RELEASE_CAPSULE | Freq: Every day | ORAL | 5 refills | Status: DC

## 2018-05-17 MED ORDER — LOSARTAN POTASSIUM 50 MG PO TABS: 50.0000 mg | ORAL_TABLET | Freq: Every day | ORAL | 5 refills | Status: DC

## 2018-05-17 NOTE — Progress Notes (Signed)
[facility] [place], [place] [address]  Internal MEDICINE  Office Visit Note  Patient Name: Dawn Carpenter  [id number]  [id number]  Date of Service: 05/18/2018   Pt is here for routine health maintenance examination   Chief Complaint  Patient presents with  . Medicare Wellness    well visit.  . Osteoporosis  . Hypertension  . Quality Metric Gaps    pneumovax, flu vaccine     The patient is here for routine health maintenance exam. She has stable history of depression and anxiety. Well controlled with current medications. She also has chronic restless legs syndrome.  She needs refills for these meds today. She has no new physical concerns or complaints. She is due to have routine, fasting labs done as well as screening mammogram.     Current Medication: Outpatient Encounter Medications as of 05/17/2018  Medication Sig  . ALPRAZolam (XANAX) 0.25 MG tablet Take 0.5 tablets (0.125 mg total) by mouth 2 (two) times daily as needed.  . Biotin 1 MG CAPS Take 1 tablet by mouth daily.  . calcium carbonate (TUMS - DOSED IN MG ELEMENTAL CALCIUM) 500 MG chewable tablet Chew 1 tablet by mouth daily.  Marland Kitchen escitalopram (LEXAPRO) 10 MG tablet Take 1 tablet (10 mg total) by mouth 2 (two) times daily.  . Ferrous Fumarate-Folic Acid (HEMATINIC/FOLIC ACID) 657-8 MG TABS Take 1 tablet by mouth daily.  Marland Kitchen levothyroxine (SYNTHROID, LEVOTHROID) 50 MCG tablet Take 1 tablet (50 mcg total) by mouth daily before breakfast.  . losartan (COZAAR) 50 MG tablet Take 1 tablet (50 mg total) by mouth daily.  . mirtazapine (REMERON) 30 MG tablet Take 1 tablet (30 mg total) by mouth at bedtime.  . Multiple Vitamin (MULTIVITAMIN) capsule Take 1 capsule by mouth daily.  Marland Kitchen omeprazole (PRILOSEC) 40 MG capsule Take 1 capsule (40 mg total) by mouth daily.  . [DISCONTINUED] ALPRAZolam (XANAX) 0.25 MG tablet Take 0.5 tablets (0.125 mg total) by mouth 2 (two) times daily as needed.  .  [DISCONTINUED] escitalopram (LEXAPRO) 10 MG tablet Take 1 tablet (10 mg total) by mouth 2 (two) times daily.  . [DISCONTINUED] levothyroxine (SYNTHROID, LEVOTHROID) 50 MCG tablet Take 1 tablet (50 mcg total) by mouth daily before breakfast.  . [DISCONTINUED] losartan (COZAAR) 50 MG tablet Take 1 tablet (50 mg total) by mouth daily.  . [DISCONTINUED] mirtazapine (REMERON) 30 MG tablet Take 1 tablet (30 mg total) by mouth at bedtime.  . [DISCONTINUED] omeprazole (PRILOSEC) 40 MG capsule Take 1 capsule (40 mg total) by mouth daily.   No facility-administered encounter medications on file as of 05/17/2018.     Surgical History: Past Surgical History:  Procedure Laterality Date  . ABDOMINAL HYSTERECTOMY    . COLONOSCOPY    . COLONOSCOPY WITH PROPOFOL N/A 05/15/2015   Procedure: COLONOSCOPY WITH PROPOFOL;  Surgeon: Manya Silvas, MD;  [facility] ENDOSCOPY;  Service: Endoscopy;  Laterality: N/A;  . ESOPHAGOGASTRODUODENOSCOPY (EGD) WITH PROPOFOL N/A 05/15/2015   Procedure: ESOPHAGOGASTRODUODENOSCOPY (EGD) WITH PROPOFOL;  Surgeon: Manya Silvas, MD;  Location: [facility] ENDOSCOPY;  Service: Endoscopy;  Laterality: N/A;  . HAND SURGERY     for arthritis  . UPPER GI ENDOSCOPY      Medical History: Past Medical History:  Diagnosis Date  . Anemia   . Anxiety   . Arthritis    rheumatoid  . Depression   . GERD (gastroesophageal reflux disease)   . Hemorrhoids   . Hypertension   . Osteoporosis   .  Thyroid disease    hypothyoridism    Family History: Family History  Problem Relation Age of Onset  . Colon cancer Father   . Heart disease Father   . Breast cancer Neg Hx       Review of Systems  Constitutional: Negative for chills, fatigue and unexpected weight change.  HENT: Negative for congestion, postnasal drip, rhinorrhea, sneezing and sore throat.   Eyes: Negative.  Negative for redness.  Respiratory: Negative for cough, chest tightness, shortness of breath and wheezing.    Cardiovascular: Negative for chest pain and palpitations.  Gastrointestinal: Negative for abdominal pain, constipation, diarrhea, nausea and vomiting.  Endocrine: Negative for cold intolerance, heat intolerance, polydipsia, polyphagia and polyuria.  Genitourinary: Negative for dysuria and frequency.  Musculoskeletal: Negative for arthralgias, back pain, joint swelling and neck pain.  Skin: Negative for rash.  Allergic/Immunologic: Negative for environmental allergies.  Neurological: Negative for dizziness, tremors, numbness and headaches.  Hematological: Negative for adenopathy. Does not bruise/bleed easily.  Psychiatric/Behavioral: Positive for dysphoric mood. Negative for behavioral problems (Depression), sleep disturbance and suicidal ideas. The patient is nervous/anxious.        Well managed depression and anxiety with current medications.     Today's Vitals   05/17/18 1438  BP: (!) 153/84  Pulse: (!) 116  Resp: 16  SpO2: 99%  Weight: 99 lb 12.8 oz (45.3 kg)  Height: 5\' 4"  (1.626 m)    Physical Exam  Constitutional: She is oriented to person, place, and time. She appears well-developed and well-nourished. No distress.  HENT:  Head: Normocephalic and atraumatic.  Nose: Nose normal.  Mouth/Throat: Oropharynx is clear and moist. No oropharyngeal exudate.  Eyes: Pupils are equal, round, and reactive to light. EOM are normal.  Neck: Normal range of motion. Neck supple. No JVD present. Carotid bruit is not present. No tracheal deviation present. No thyromegaly present.  Cardiovascular: Normal rate, regular rhythm, normal heart sounds and intact distal pulses. Exam reveals no gallop and no friction rub.  No murmur heard. Pulmonary/Chest: Effort normal and breath sounds normal. No respiratory distress. She has no wheezes. She has no rales. She exhibits no tenderness. Right breast exhibits no inverted nipple, no mass, no nipple discharge, no skin change and no tenderness. Left breast  exhibits no inverted nipple, no mass, no nipple discharge, no skin change and no tenderness.  Abdominal: Soft. Bowel sounds are normal. There is no tenderness.  Musculoskeletal: Normal range of motion.  Lymphadenopathy:    She has no cervical adenopathy.  Neurological: She is alert and oriented to person, place, and time. No cranial nerve deficit.  Skin: Skin is warm and dry. Capillary refill takes less than 2 seconds. She is not diaphoretic.  Psychiatric: She has a normal mood and affect. Her behavior is normal. Judgment and thought content normal.  Nursing note and vitals reviewed.  Depression screen PHQ 2/9 09/17/2017  Decreased Interest 1  Down, Depressed, Hopeless 2  PHQ - 2 Score 3  Altered sleeping 0  Tired, decreased energy 2  Change in appetite 1  Feeling bad or failure about yourself  1  Trouble concentrating 0  Moving slowly or fidgety/restless 0  Suicidal thoughts 0  PHQ-9 Score 7  Difficult doing work/chores Somewhat difficult    Functional Status Survey: Is the patient deaf or have difficulty hearing?: No Does the patient have difficulty seeing, even when wearing glasses/contacts?: Yes Does the patient have difficulty concentrating, remembering, or making decisions?: No Does the patient have difficulty walking or  climbing stairs?: Yes(due to vision) Does the patient have difficulty dressing or bathing?: No Does the patient have difficulty doing errands alone such as visiting a doctor's office or shopping?: Yes(pt daughter assist)  [place] Exam 05/17/2018  Orientation to time 5  Orientation to Place 5  Registration 3  Attention/ Calculation 5  Recall 3  Language- name 2 objects 2  Language- repeat 1  Language- follow 3 step command 3  Language- read & follow direction 1  Write a sentence 1  Copy design 1  Total score 30    Fall Risk  05/17/2018 05/17/2018 09/17/2017  Falls in the past year? No No No     LABS: Recent Results (from the  past 2160 hour(s))  UA/M w/rflx Culture, Routine     Status: Abnormal (Preliminary result)   Collection Time: 05/17/18  2:24 PM  Result Value Ref Range   Specific Gravity, UA 1.019 1.005 - 1.030   pH, UA 5.0 5.0 - 7.5   Color, UA Yellow Yellow   Appearance Ur Cloudy (A) Clear   Leukocytes, UA 1+ (A) Negative   Protein, UA Negative Negative/Trace   Glucose, UA Negative Negative   Ketones, UA Negative Negative   RBC, UA 2+ (A) Negative   Bilirubin, UA Negative Negative   Urobilinogen, Ur 0.2 0.2 - 1.0 mg/dL   Nitrite, UA Negative Negative   Microscopic Examination See below:     Comment: Microscopic was indicated and was performed.   Urinalysis Reflex Comment     Comment: This specimen has reflexed to a Urine Culture.  Microscopic Examination     Status: Abnormal   Collection Time: 05/17/18  2:24 PM  Result Value Ref Range   WBC, UA 6-10 (A) 0 - 5 /hpf   RBC, UA 3-10 (A) 0 - 2 /hpf   Epithelial Cells (non renal) 0-10 0 - 10 /hpf   Casts None seen None seen /lpf   Crystals Present (A) N/A   Crystal Type Amorphous Sediment N/A   Mucus, UA Present Not Estab.   Bacteria, UA Many (A) None seen/Few  Urine Culture, Reflex     Status: None (Preliminary result)   Collection Time: 05/17/18  2:24 PM  Result Value Ref Range   Urine Culture, Routine WILL FOLLOW    Assessment/Plan: 1. Encounter for general adult medical examination with abnormal findings Annual health maintenance exam today  2. Essential hypertension Stable. Continue losartan as prescribed  - losartan (COZAAR) 50 MG tablet; Take 1 tablet (50 mg total) by mouth daily.  Dispense: 30 tablet; Refill: 5  3. Acquired hypothyroidism Check thyroid panel and adjust levothyroxine as indicated.  - levothyroxine (SYNTHROID, LEVOTHROID) 50 MCG tablet; Take 1 tablet (50 mcg total) by mouth daily before breakfast.  Dispense: 30 tablet; Refill: 5  4. Moderate major depression (HCC) Continue lexapro and mirtazapine as prescribed.  Refills provided today.  - escitalopram (LEXAPRO) 10 MG tablet; Take 1 tablet (10 mg total) by mouth 2 (two) times daily.  Dispense: 60 tablet; Refill: 5 - mirtazapine (REMERON) 30 MG tablet; Take 1 tablet (30 mg total) by mouth at bedtime.  Dispense: 30 tablet; Refill: 5  5. Generalized anxiety disorder May continue alprazolam 0.25mg , taking 1/2 tablet twice daily when needed. New rx sent to pharmacy today.  - ALPRAZolam (XANAX) 0.25 MG tablet; Take 0.5 tablets (0.125 mg total) by mouth 2 (two) times daily as needed.  Dispense: 30 tablet; Refill: 3  6. Gastroesophageal reflux disease without esophagitis -  omeprazole (PRILOSEC) 40 MG capsule; Take 1 capsule (40 mg total) by mouth daily.  Dispense: 30 capsule; Refill: 5  7. Need for vaccination against Streptococcus pneumoniae using pneumococcal conjugate vaccine 13 - Pneumococcal conjugate vaccine 13-valent IM  8. Screening for breast cancer - MM DIGITAL SCREENING BILATERAL; Future  9. Dysuria - UA/M w/rflx Culture, Routine  General Counseling: Dawn Carpenter understanding of the findings of todays visit and agrees with plan of treatment. I have discussed any further diagnostic evaluation that may be needed or ordered today. We also reviewed her medications today. she has been encouraged to call the office with any questions or concerns that should arise related to todays visit.    Counseling:  Hypertension Counseling:   The following hypertensive lifestyle modification were recommended and discussed:  1. Limiting alcohol intake to less than 1 oz/day of ethanol:(24 oz of beer or 8 oz of wine or 2 oz of 100-proof whiskey). 2. Take baby ASA 81 mg daily. 3. Importance of regular aerobic exercise and losing weight. 4. Reduce dietary saturated fat and cholesterol intake for overall cardiovascular health. 5. Maintaining adequate dietary potassium, calcium, and magnesium intake. 6. Regular monitoring of the blood pressure. 7. Reduce  sodium intake to less than 100 mmol/day (less than 2.3 gm of sodium or less than 6 gm of sodium choride)   This patient was seen by [place] with Dr Lavera Guise as a part of collaborative care agreement  Orders Placed This Encounter  Procedures  . Microscopic Examination  . Urine Culture, Reflex  . MM DIGITAL SCREENING BILATERAL  . Pneumococcal conjugate vaccine 13-valent IM  . UA/M w/rflx Culture, Routine    Meds ordered this encounter  Medications  . ALPRAZolam (XANAX) 0.25 MG tablet    Sig: Take 0.5 tablets (0.125 mg total) by mouth 2 (two) times daily as needed.    Dispense:  30 tablet    Refill:  3    Order Specific Question:   Supervising Provider    Answer:   Lavera Guise [[id number]]  . escitalopram (LEXAPRO) 10 MG tablet    Sig: Take 1 tablet (10 mg total) by mouth 2 (two) times daily.    Dispense:  60 tablet    Refill:  5    Order Specific Question:   Supervising Provider    Answer:   Lavera Guise [[id number]]  . levothyroxine (SYNTHROID, LEVOTHROID) 50 MCG tablet    Sig: Take 1 tablet (50 mcg total) by mouth daily before breakfast.    Dispense:  30 tablet    Refill:  5    Order Specific Question:   Supervising Provider    Answer:   Lavera Guise [place]  . losartan (COZAAR) 50 MG tablet    Sig: Take 1 tablet (50 mg total) by mouth daily.    Dispense:  30 tablet    Refill:  5    Order Specific Question:   Supervising Provider    Answer:   Lavera Guise [[id number]]  . mirtazapine (REMERON) 30 MG tablet    Sig: Take 1 tablet (30 mg total) by mouth at bedtime.    Dispense:  30 tablet    Refill:  5    Order Specific Question:   Supervising Provider    Answer:   Lavera Guise [[id number]]  . omeprazole (PRILOSEC) 40 MG capsule    Sig: Take 1 capsule (40 mg total) by mouth daily.    Dispense:  30 capsule  Refill:  5    Order Specific Question:   Supervising Provider    Answer:   Lavera Guise [[id number]]    Time spent: Wood,  MD  Internal Medicine

## 2018-05-18 DIAGNOSIS — R300 Dysuria: Secondary | ICD-10-CM | POA: Insufficient documentation

## 2018-05-18 DIAGNOSIS — Z23 Encounter for immunization: Secondary | ICD-10-CM | POA: Insufficient documentation

## 2018-05-18 DIAGNOSIS — Z1239 Encounter for other screening for malignant neoplasm of breast: Secondary | ICD-10-CM | POA: Insufficient documentation

## 2018-05-20 LAB — URINE CULTURE, REFLEX

## 2018-05-20 LAB — UA/M W/RFLX CULTURE, ROUTINE
BILIRUBIN UA: NEGATIVE
GLUCOSE, UA: NEGATIVE
KETONES UA: NEGATIVE
NITRITE UA: NEGATIVE
Protein, UA: NEGATIVE
SPEC GRAV UA: 1.019 (ref 1.005–1.030)
Urobilinogen, Ur: 0.2 mg/dL (ref 0.2–1.0)
pH, UA: 5 (ref 5.0–7.5)

## 2018-05-20 LAB — MICROSCOPIC EXAMINATION: CASTS: NONE SEEN /LPF

## 2018-08-26 ENCOUNTER — Other Ambulatory Visit: Payer: Self-pay

## 2018-08-26 DIAGNOSIS — I10 Essential (primary) hypertension: Secondary | ICD-10-CM

## 2018-08-26 DIAGNOSIS — F321 Major depressive disorder, single episode, moderate: Secondary | ICD-10-CM

## 2018-08-26 MED ORDER — MIRTAZAPINE 30 MG PO TABS: 30.0000 mg | ORAL_TABLET | Freq: Every day | ORAL | 5 refills | Status: DC

## 2018-08-26 MED ORDER — LOSARTAN POTASSIUM 50 MG PO TABS: 50.0000 mg | ORAL_TABLET | Freq: Every day | ORAL | 5 refills | Status: DC

## 2018-09-19 ENCOUNTER — Ambulatory Visit: Payer: Self-pay | Admitting: Nurse Practitioner

## 2018-09-26 DIAGNOSIS — R0602 Shortness of breath: Secondary | ICD-10-CM | POA: Diagnosis not present

## 2018-09-26 DIAGNOSIS — R062 Wheezing: Secondary | ICD-10-CM | POA: Diagnosis not present

## 2018-09-27 ENCOUNTER — Inpatient Hospital Stay (HOSPITAL_COMMUNITY)
Admit: 2018-09-27 | Discharge: 2018-09-27 | Disposition: A | Discharge status: Home / Self Care | Payer: PPO | Attending: Pulmonary Disease | Admitting: Pulmonary Disease

## 2018-09-27 ENCOUNTER — Inpatient Hospital Stay
Admission: EM | Admit: 2018-09-27 | Discharge: 2018-10-01 | DRG: 291 | Disposition: A | Discharge status: Home Health | Payer: PPO | Attending: Internal Medicine | Admitting: Internal Medicine

## 2018-09-27 ENCOUNTER — Telehealth: Payer: Self-pay | Admitting: Internal Medicine

## 2018-09-27 ENCOUNTER — Emergency Department: Payer: PPO

## 2018-09-27 ENCOUNTER — Encounter: Payer: Self-pay | Admitting: Internal Medicine

## 2018-09-27 DIAGNOSIS — I350 Nonrheumatic aortic (valve) stenosis: Secondary | ICD-10-CM | POA: Diagnosis not present

## 2018-09-27 DIAGNOSIS — M810 Age-related osteoporosis without current pathological fracture: Secondary | ICD-10-CM | POA: Diagnosis present

## 2018-09-27 DIAGNOSIS — D500 Iron deficiency anemia secondary to blood loss (chronic): Secondary | ICD-10-CM | POA: Diagnosis not present

## 2018-09-27 DIAGNOSIS — E039 Hypothyroidism, unspecified: Secondary | ICD-10-CM | POA: Diagnosis not present

## 2018-09-27 DIAGNOSIS — Z79899 Other long term (current) drug therapy: Secondary | ICD-10-CM | POA: Diagnosis not present

## 2018-09-27 DIAGNOSIS — D638 Anemia in other chronic diseases classified elsewhere: Secondary | ICD-10-CM | POA: Diagnosis not present

## 2018-09-27 DIAGNOSIS — I255 Ischemic cardiomyopathy: Secondary | ICD-10-CM | POA: Diagnosis not present

## 2018-09-27 DIAGNOSIS — I509 Heart failure, unspecified: Secondary | ICD-10-CM

## 2018-09-27 DIAGNOSIS — F411 Generalized anxiety disorder: Secondary | ICD-10-CM | POA: Diagnosis not present

## 2018-09-27 DIAGNOSIS — Z90710 Acquired absence of both cervix and uterus: Secondary | ICD-10-CM | POA: Diagnosis not present

## 2018-09-27 DIAGNOSIS — R7989 Other specified abnormal findings of blood chemistry: Secondary | ICD-10-CM | POA: Diagnosis not present

## 2018-09-27 DIAGNOSIS — E876 Hypokalemia: Secondary | ICD-10-CM | POA: Diagnosis not present

## 2018-09-27 DIAGNOSIS — F17210 Nicotine dependence, cigarettes, uncomplicated: Secondary | ICD-10-CM | POA: Diagnosis not present

## 2018-09-27 DIAGNOSIS — Z8249 Family history of ischemic heart disease and other diseases of the circulatory system: Secondary | ICD-10-CM

## 2018-09-27 DIAGNOSIS — E43 Unspecified severe protein-calorie malnutrition: Secondary | ICD-10-CM | POA: Diagnosis not present

## 2018-09-27 DIAGNOSIS — I5023 Acute on chronic systolic (congestive) heart failure: Secondary | ICD-10-CM | POA: Diagnosis not present

## 2018-09-27 DIAGNOSIS — I5021 Acute systolic (congestive) heart failure: Secondary | ICD-10-CM | POA: Diagnosis not present

## 2018-09-27 DIAGNOSIS — T501X5A Adverse effect of loop [high-ceiling] diuretics, initial encounter: Secondary | ICD-10-CM | POA: Diagnosis present

## 2018-09-27 DIAGNOSIS — J9602 Acute respiratory failure with hypercapnia: Secondary | ICD-10-CM | POA: Diagnosis not present

## 2018-09-27 DIAGNOSIS — D509 Iron deficiency anemia, unspecified: Secondary | ICD-10-CM

## 2018-09-27 DIAGNOSIS — I340 Nonrheumatic mitral (valve) insufficiency: Secondary | ICD-10-CM | POA: Diagnosis not present

## 2018-09-27 DIAGNOSIS — F329 Major depressive disorder, single episode, unspecified: Secondary | ICD-10-CM | POA: Diagnosis present

## 2018-09-27 DIAGNOSIS — J90 Pleural effusion, not elsewhere classified: Secondary | ICD-10-CM | POA: Diagnosis not present

## 2018-09-27 DIAGNOSIS — Z79890 Hormone replacement therapy (postmenopausal): Secondary | ICD-10-CM

## 2018-09-27 DIAGNOSIS — J9601 Acute respiratory failure with hypoxia: Secondary | ICD-10-CM | POA: Diagnosis present

## 2018-09-27 DIAGNOSIS — Z681 Body mass index (BMI) 19 or less, adult: Secondary | ICD-10-CM

## 2018-09-27 DIAGNOSIS — K219 Gastro-esophageal reflux disease without esophagitis: Secondary | ICD-10-CM | POA: Diagnosis not present

## 2018-09-27 DIAGNOSIS — I110 Hypertensive heart disease with heart failure: Secondary | ICD-10-CM | POA: Diagnosis not present

## 2018-09-27 DIAGNOSIS — I248 Other forms of acute ischemic heart disease: Secondary | ICD-10-CM | POA: Diagnosis not present

## 2018-09-27 DIAGNOSIS — I25118 Atherosclerotic heart disease of native coronary artery with other forms of angina pectoris: Secondary | ICD-10-CM

## 2018-09-27 DIAGNOSIS — I420 Dilated cardiomyopathy: Secondary | ICD-10-CM | POA: Diagnosis not present

## 2018-09-27 DIAGNOSIS — D649 Anemia, unspecified: Secondary | ICD-10-CM

## 2018-09-27 DIAGNOSIS — J811 Chronic pulmonary edema: Secondary | ICD-10-CM | POA: Diagnosis not present

## 2018-09-27 DIAGNOSIS — J9600 Acute respiratory failure, unspecified whether with hypoxia or hypercapnia: Secondary | ICD-10-CM | POA: Diagnosis not present

## 2018-09-27 DIAGNOSIS — R634 Abnormal weight loss: Secondary | ICD-10-CM | POA: Diagnosis not present

## 2018-09-27 DIAGNOSIS — I361 Nonrheumatic tricuspid (valve) insufficiency: Secondary | ICD-10-CM | POA: Diagnosis not present

## 2018-09-27 DIAGNOSIS — J9690 Respiratory failure, unspecified, unspecified whether with hypoxia or hypercapnia: Secondary | ICD-10-CM | POA: Diagnosis not present

## 2018-09-27 DIAGNOSIS — J441 Chronic obstructive pulmonary disease with (acute) exacerbation: Secondary | ICD-10-CM | POA: Diagnosis present

## 2018-09-27 DIAGNOSIS — J9621 Acute and chronic respiratory failure with hypoxia: Secondary | ICD-10-CM | POA: Diagnosis not present

## 2018-09-27 DIAGNOSIS — J9622 Acute and chronic respiratory failure with hypercapnia: Secondary | ICD-10-CM | POA: Diagnosis not present

## 2018-09-27 LAB — INFLUENZA PANEL BY PCR (TYPE A & B)
Influenza A By PCR: NEGATIVE
Influenza B By PCR: NEGATIVE

## 2018-09-27 LAB — BASIC METABOLIC PANEL
Anion gap: 15 (ref 5–15)
BUN: 8 mg/dL (ref 8–23)
CO2: 14 mmol/L — ABNORMAL LOW (ref 22–32)
Calcium: 7.9 mg/dL — ABNORMAL LOW (ref 8.9–10.3)
Chloride: 106 mmol/L (ref 98–111)
Creatinine, Ser: 0.83 mg/dL (ref 0.44–1.00)
GFR calc Af Amer: >60 mL/min (ref >60)
GFR calc non Af Amer: >60 mL/min (ref >60)
Glucose, Bld: 275 mg/dL — ABNORMAL HIGH (ref 70–99)
Potassium: 3.8 mmol/L (ref 3.5–5.1)
Sodium: 135 mmol/L (ref 135–145)

## 2018-09-27 LAB — ECHOCARDIOGRAM COMPLETE
Height: 64 in
Weight: 1742.52 oz

## 2018-09-27 LAB — LIPID PANEL
Cholesterol: 109 mg/dL (ref 0–200)
HDL: 49 mg/dL (ref >40)
LDL Cholesterol: 52 mg/dL (ref 0–99)
TRIGLYCERIDES: 42 mg/dL (ref <150)
Total CHOL/HDL Ratio: 2.2 RATIO
VLDL: 8 mg/dL (ref 0–40)

## 2018-09-27 LAB — HEMOGLOBIN AND HEMATOCRIT, BLOOD
HCT: 21.1 % — ABNORMAL LOW (ref 36.0–46.0)
HCT: 23.5 % — ABNORMAL LOW (ref 36.0–46.0)
HEMOGLOBIN: 6.3 g/dL — AB (ref 12.0–15.0)
Hemoglobin: 7.5 g/dL — ABNORMAL LOW (ref 12.0–15.0)

## 2018-09-27 LAB — FERRITIN: Ferritin: 4 ng/mL — ABNORMAL LOW (ref 11–307)

## 2018-09-27 LAB — IRON AND TIBC
Iron: 133 ug/dL (ref 28–170)
Saturation Ratios: 20 % (ref 10.4–31.8)
TIBC: 676 ug/dL — ABNORMAL HIGH (ref 250–450)
UIBC: 543 ug/dL

## 2018-09-27 LAB — TSH: TSH: 2.31 u[IU]/mL (ref 0.350–4.500)

## 2018-09-27 LAB — PREPARE RBC (CROSSMATCH)

## 2018-09-27 LAB — ABO/RH: ABO/RH(D): A NEG

## 2018-09-27 LAB — LACTATE DEHYDROGENASE: LDH: 179 U/L (ref 98–192)

## 2018-09-27 LAB — BRAIN NATRIURETIC PEPTIDE: B Natriuretic Peptide: 1456 pg/mL — ABNORMAL HIGH (ref 0.0–100.0)

## 2018-09-27 LAB — FOLATE: Folate: 20.2 ng/mL (ref >5.9)

## 2018-09-27 LAB — MRSA PCR SCREENING: MRSA by PCR: NEGATIVE

## 2018-09-27 LAB — TROPONIN I
Troponin I: 0.04 ng/mL (ref <0.03)
Troponin I: 0.04 ng/mL (ref <0.03)

## 2018-09-27 LAB — GLUCOSE, CAPILLARY: Glucose-Capillary: 168 mg/dL — ABNORMAL HIGH (ref 70–99)

## 2018-09-27 MED ORDER — SODIUM CHLORIDE 0.9 % IV BOLUS
500.0000 mL | Freq: Once | INTRAVENOUS | Status: AC
  Administered 2018-09-27: 500 mL via INTRAVENOUS

## 2018-09-27 MED ORDER — METHYLPREDNISOLONE SODIUM SUCC 125 MG IJ SOLR
60.0000 mg | Freq: Once | INTRAMUSCULAR | Status: AC
  Administered 2018-09-27: 60 mg via INTRAVENOUS
  Filled 2018-09-27: qty 2

## 2018-09-27 MED ORDER — SODIUM CHLORIDE 0.9% IV SOLUTION
Freq: Once | INTRAVENOUS | Status: AC
  Administered 2018-09-27: 18:00:00 via INTRAVENOUS

## 2018-09-27 MED ORDER — BUDESONIDE 0.5 MG/2ML IN SUSP
0.5000 mg | Freq: Two times a day (BID) | RESPIRATORY_TRACT | Status: DC
  Administered 2018-09-27 – 2018-09-29 (×4): 0.5 mg via RESPIRATORY_TRACT
  Filled 2018-09-27 (×4): qty 2

## 2018-09-27 MED ORDER — IPRATROPIUM-ALBUTEROL 0.5-2.5 (3) MG/3ML IN SOLN
6.0000 mL | Freq: Once | RESPIRATORY_TRACT | Status: AC
  Administered 2018-09-27: 6 mL via RESPIRATORY_TRACT
  Filled 2018-09-27: qty 6

## 2018-09-27 MED ORDER — IPRATROPIUM-ALBUTEROL 0.5-2.5 (3) MG/3ML IN SOLN
3.0000 mL | RESPIRATORY_TRACT | Status: DC
  Administered 2018-09-27 – 2018-09-28 (×6): 3 mL via RESPIRATORY_TRACT
  Filled 2018-09-27 (×5): qty 3

## 2018-09-27 MED ORDER — LEVOTHYROXINE SODIUM 50 MCG PO TABS
50.0000 ug | ORAL_TABLET | Freq: Every day | ORAL | Status: DC
  Administered 2018-09-28 – 2018-10-01 (×4): 50 ug via ORAL
  Filled 2018-09-27 (×4): qty 1

## 2018-09-27 MED ORDER — METHYLPREDNISOLONE SODIUM SUCC 125 MG IJ SOLR
80.0000 mg | Freq: Once | INTRAMUSCULAR | Status: AC
  Administered 2018-09-27: 80 mg via INTRAVENOUS
  Filled 2018-09-27: qty 2

## 2018-09-27 MED ORDER — METHYLPREDNISOLONE SODIUM SUCC 40 MG IJ SOLR
40.0000 mg | Freq: Two times a day (BID) | INTRAMUSCULAR | Status: DC
  Administered 2018-09-27 – 2018-09-28 (×2): 40 mg via INTRAVENOUS
  Filled 2018-09-27 (×2): qty 1

## 2018-09-27 MED ORDER — DOCUSATE SODIUM 100 MG PO CAPS
100.0000 mg | ORAL_CAPSULE | Freq: Two times a day (BID) | ORAL | Status: DC
  Administered 2018-09-29 – 2018-10-01 (×2): 100 mg via ORAL
  Filled 2018-09-27 (×4): qty 1

## 2018-09-27 MED ORDER — FUROSEMIDE 10 MG/ML IJ SOLN
20.0000 mg | Freq: Once | INTRAMUSCULAR | Status: AC
  Administered 2018-09-27: 20 mg via INTRAVENOUS
  Filled 2018-09-27: qty 2

## 2018-09-27 MED ORDER — FUROSEMIDE 10 MG/ML IJ SOLN
20.0000 mg | Freq: Two times a day (BID) | INTRAMUSCULAR | Status: DC
  Administered 2018-09-27 – 2018-09-30 (×6): 20 mg via INTRAVENOUS
  Filled 2018-09-27 (×6): qty 2

## 2018-09-27 MED ORDER — FUROSEMIDE 10 MG/ML IJ SOLN
40.0000 mg | Freq: Once | INTRAMUSCULAR | Status: AC
  Administered 2018-09-27: 40 mg via INTRAVENOUS
  Filled 2018-09-27: qty 4

## 2018-09-27 MED ORDER — ONDANSETRON HCL 4 MG PO TABS: 4.0000 mg | ORAL_TABLET | Freq: Four times a day (QID) | ORAL | Status: DC | PRN

## 2018-09-27 MED ORDER — TRAZODONE HCL 50 MG PO TABS
50.0000 mg | ORAL_TABLET | Freq: Every evening | ORAL | Status: DC | PRN
  Administered 2018-09-27 – 2018-09-30 (×2): 50 mg via ORAL
  Filled 2018-09-27 (×2): qty 1

## 2018-09-27 MED ORDER — SODIUM CHLORIDE 0.9 % IV SOLN
10.0000 mL/h | Freq: Once | INTRAVENOUS | Status: AC
  Administered 2018-09-27: 10 mL/h via INTRAVENOUS

## 2018-09-27 MED ORDER — LOSARTAN POTASSIUM 25 MG PO TABS
25.0000 mg | ORAL_TABLET | Freq: Every day | ORAL | Status: DC
  Administered 2018-09-28 – 2018-09-29 (×2): 25 mg via ORAL
  Filled 2018-09-27 (×2): qty 1

## 2018-09-27 MED ORDER — ACETAMINOPHEN 325 MG PO TABS
650.0000 mg | ORAL_TABLET | Freq: Four times a day (QID) | ORAL | Status: DC | PRN
  Administered 2018-09-27 – 2018-09-29 (×2): 650 mg via ORAL
  Filled 2018-09-27 (×2): qty 2

## 2018-09-27 MED ORDER — LEVALBUTEROL HCL 1.25 MG/0.5ML IN NEBU
1.2500 mg | INHALATION_SOLUTION | Freq: Four times a day (QID) | RESPIRATORY_TRACT | Status: DC | PRN
  Administered 2018-09-27 (×2): 1.25 mg via RESPIRATORY_TRACT
  Filled 2018-09-27 (×2): qty 0.5

## 2018-09-27 MED ORDER — PANTOPRAZOLE SODIUM 40 MG IV SOLR
40.0000 mg | Freq: Two times a day (BID) | INTRAVENOUS | Status: DC
  Administered 2018-09-27 – 2018-09-30 (×6): 40 mg via INTRAVENOUS
  Filled 2018-09-27 (×6): qty 40

## 2018-09-27 MED ORDER — METHYLPREDNISOLONE SODIUM SUCC 40 MG IJ SOLR: 40.0000 mg | Freq: Once | INTRAMUSCULAR | Status: DC

## 2018-09-27 MED ORDER — BUDESONIDE 0.25 MG/2ML IN SUSP
0.2500 mg | Freq: Two times a day (BID) | RESPIRATORY_TRACT | Status: DC
  Administered 2018-09-27: 0.25 mg via RESPIRATORY_TRACT
  Filled 2018-09-27: qty 2

## 2018-09-27 MED ORDER — CARVEDILOL 6.25 MG PO TABS
6.2500 mg | ORAL_TABLET | Freq: Two times a day (BID) | ORAL | Status: DC
  Administered 2018-09-28 – 2018-09-29 (×3): 6.25 mg via ORAL
  Filled 2018-09-27 (×4): qty 1

## 2018-09-27 MED ORDER — ONDANSETRON HCL 4 MG/2ML IJ SOLN: 4.0000 mg | Freq: Four times a day (QID) | INTRAMUSCULAR | Status: DC | PRN

## 2018-09-27 MED ORDER — IPRATROPIUM BROMIDE 0.02 % IN SOLN
0.5000 mg | Freq: Four times a day (QID) | RESPIRATORY_TRACT | Status: DC | PRN
  Administered 2018-09-27: 0.5 mg via RESPIRATORY_TRACT
  Filled 2018-09-27: qty 2.5

## 2018-09-27 MED ORDER — ACETAMINOPHEN 650 MG RE SUPP: 650.0000 mg | Freq: Four times a day (QID) | RECTAL | Status: DC | PRN

## 2018-09-27 NOTE — Consult Note (Signed)
Cardiology Consultation:   Patient ID: Dawn Carpenter MRN: [id number]; DOB: [birth year]  Admit date: 09/27/2018 Date of Consult: 09/27/2018  Primary Care Provider: Lavera Guise, MD Primary Cardiologist: Iverson Alamin, Dr. Rockey Situ Primary Electrophysiologist:  None    Patient Profile:   Dawn Carpenter is a 73 y.o. female with a hx of HTN, current tobacco abuse, GI bleed with h/o ulcer and adenomatous polyp of colon, anemia, RA, OA, and GERD who is being seen today for the evaluation of dilated CM at the request of Dr. Alva Garnet.  History of Present Illness:   Dawn Carpenter is a 73 yo female with PMH as above and no previous cardiac history per review of EMR. Patient presented to [facility] ED 09/27/2018 with complaint of shortness of breath that started that same evening.  On further questioning, it was revealed that patient has had SOB for several months and associated with orthopnea and PND. She also reported dry and nonproductive cough.  On presentation, oxygen saturation 80%.  Labs significant for hemoglobin 2.6, troponin minimally elevated at 0.04.  EKG notable for sinus tachycardia with rate 109, poor R wave progression.  Chest x-ray showed cardiac enlargement with perihilar interstitial edema and small bilateral pleural effusions.  In the ED, IV Lasix administered for pulmonary edema seen on CXR.  Cardiology consulted for presumed CM with no previous echo available per review of EMR. Echo performed this admission showing significantly reduced EF 25-30%, AS with degree unknown considering low output HF, and hypokinesis without RWMA as below. Severe anemia without symptoms of bleeding including no hemoptysis, melena, or hematochezia reported by patient at this time. Hgb improvement to 6.3 s/p transfusion.   Past Medical History:  Diagnosis Date  . Anemia   . Anxiety   . Arthritis    rheumatoid  . Depression   . GERD (gastroesophageal reflux disease)   . Hemorrhoids   . Hypertension   .  Osteoporosis   . Thyroid disease    hypothyoridism    Past Surgical History:  Procedure Laterality Date  . ABDOMINAL HYSTERECTOMY    . COLONOSCOPY    . COLONOSCOPY WITH PROPOFOL N/A 05/15/2015   Procedure: COLONOSCOPY WITH PROPOFOL;  Surgeon: Manya Silvas, MD;  Location: [facility] ENDOSCOPY;  Service: Endoscopy;  Laterality: N/A;  . ESOPHAGOGASTRODUODENOSCOPY (EGD) WITH PROPOFOL N/A 05/15/2015   Procedure: ESOPHAGOGASTRODUODENOSCOPY (EGD) WITH PROPOFOL;  Surgeon: Manya Silvas, MD;  Location: [facility] ENDOSCOPY;  Service: Endoscopy;  Laterality: N/A;  . HAND SURGERY     for arthritis  . UPPER GI ENDOSCOPY       Home Medications:  Prior to Admission medications   Medication Sig Start Date End Date Taking? Authorizing Provider  ALPRAZolam (XANAX) 0.25 MG tablet Take 0.5 tablets (0.125 mg total) by mouth 2 (two) times daily as needed. 05/17/18  Yes Boscia, Greer Ee, NP  Biotin 1 MG CAPS Take 1 tablet by mouth daily.   Yes [provider]  calcium carbonate (TUMS - DOSED IN MG ELEMENTAL CALCIUM) 500 MG chewable tablet Chew 1 tablet by mouth daily.   Yes [provider]  escitalopram (LEXAPRO) 10 MG tablet Take 1 tablet (10 mg total) by mouth 2 (two) times daily. 05/17/18  Yes Boscia, Greer Ee, NP  Ferrous Fumarate-Folic Acid (HEMATINIC/FOLIC ACID) 409-8 MG TABS Take 1 tablet by mouth daily.    Yes [provider]  levothyroxine (SYNTHROID, LEVOTHROID) 50 MCG tablet Take 1 tablet (50 mcg total) by mouth daily before breakfast. 05/17/18  Yes Boscia,  Heather E, NP  losartan (COZAAR) 50 MG tablet Take 1 tablet (50 mg total) by mouth daily. 08/26/18  Yes Boscia, Greer Ee, NP  mirtazapine (REMERON) 30 MG tablet Take 1 tablet (30 mg total) by mouth at bedtime. 08/26/18  Yes Ronnell Freshwater, NP  Multiple Vitamin (MULTIVITAMIN) capsule Take 1 capsule by mouth daily.   Yes [provider]  omeprazole (PRILOSEC) 40 MG capsule Take 1 capsule (40 mg total) by mouth  daily. 05/17/18  Yes Ronnell Freshwater, NP    Inpatient Medications: Scheduled Meds: . docusate sodium  100 mg Oral BID  . levothyroxine  50 mcg Oral QAC breakfast  . pantoprazole (PROTONIX) IV  40 mg Intravenous Q12H   Continuous Infusions:  PRN Meds: acetaminophen **OR** acetaminophen, ipratropium, levalbuterol, ondansetron **OR** ondansetron (ZOFRAN) IV  Allergies:    Allergies  Allergen Reactions  . Wellbutrin [Bupropion] Other (See Comments)    Generic form.    Social History:   Social History   Socioeconomic History  . Marital status: Single    Spouse name: Not on file  . Number of children: Not on file  . Years of education: Not on file  . Highest education level: Not on file  Occupational History  . Not on file  Social Needs  . Financial resource strain: Not on file  . Food insecurity:    Worry: Not on file    Inability: Not on file  . Transportation needs:    Medical: Not on file    Non-medical: Not on file  Tobacco Use  . Smoking status: Current Every Day Smoker    Packs/day: 0.50    Years: 40.00    Pack years: 20.00    Types: Cigarettes  . Smokeless tobacco: Never Used  . Tobacco comment: Hx of 1 PPD from 73 years old until 01/2016  Substance and Sexual Activity  . Alcohol use: No    Frequency: Never  . Drug use: No  . Sexual activity: Not on file  Lifestyle  . Physical activity:    Days per week: Not on file    Minutes per session: Not on file  . Stress: Not on file  Relationships  . Social connections:    Talks on phone: Not on file    Gets together: Not on file    Attends religious service: Not on file    Active member of club or organization: Not on file    Attends meetings of clubs or organizations: Not on file    Relationship status: Not on file  . Intimate partner violence:    Fear of current or ex partner: Not on file    Emotionally abused: Not on file    Physically abused: Not on file    Forced sexual activity: Not on file    Other Topics Concern  . Not on file  Social History Narrative  . Not on file    Family History:    Family History  Problem Relation Age of Onset  . Colon cancer Father   . Heart disease Father   . Hypertension Mother   . Breast cancer Neg Hx      ROS:  Please see the history of present illness.   Review of Systems  Constitutional: Positive for malaise/fatigue. Negative for chills and fever.  Respiratory: Positive for cough and wheezing. Negative for hemoptysis.        SOB over last few months and worsening in past couple of days  Cardiovascular: Positive  for orthopnea and PND. Negative for chest pain, palpitations and leg swelling.  Gastrointestinal: Negative for blood in stool, nausea and vomiting.  Genitourinary: Negative for hematuria.  Psychiatric/Behavioral: Negative for hallucinations and substance abuse.  All other systems reviewed and are negative.   All other ROS reviewed and negative.     Physical Exam/Data:   Vitals:   09/27/18 0700 09/27/18 0800 09/27/18 0900 09/27/18 1000  BP: (!) 150/95 94/77 128/74 130/80  Pulse: (!) 120 87 84 88  Resp: (!) 23 (!) 28 (!) 27 (!) 27  Temp:  (!) 96 F (35.6 C)    TempSrc:  Axillary    SpO2: 100% 98% 93% 93%  Weight:      Height:        Intake/Output Summary (Last 24 hours) at 09/27/2018 1045 Last data filed at 09/27/2018 0403 Gross per 24 hour  Intake 450 ml  Output -  Net 450 ml   Filed Weights   09/27/18 0008 09/27/18 0630  Weight: 48.5 kg 49.4 kg   Body mass index is 18.69 kg/m.  General:  Elderly female in NAD on Bipap HEENT: normal. Currently on Cpap/Bipap Neck: no JVD Vascular: Radial pulses 2+ bilaterally   Cardiac: tachycardic, regular rate; 1/6 systolic murmur / flow murmur appreciated Lungs: +++ wheezing and bilateral rales with reduced breath sounds at the bases L>R Abd: soft, nontender, no hepatomegaly  Ext: no bilateral lower extremity edema Musculoskeletal:  No deformities Skin: warm and dry   Neuro:  no focal abnormalities noted Psych:  Normal affect   EKG: SR, 73bpm Telemetry:  Telemetry was personally reviewed and demonstrates:  SR and 3 beats NSVT/ PVC at 6:30AM, ectopy with rates 90-low 100s  CV Studies:   Relevant CV Studies: 09/26/2017 TTE  1. The left ventricle has severely reduced systolic function, with an ejection fraction of 25-30%. The cavity size was normal. There is mildly increased left ventricular wall thickness. Left ventricular diastolic Doppler parameters are consistent with  pseudonormalization. Left ventrical global hypokinesis without regional wall motion abnormalities.  2. The right ventricle has normal systolic function. The cavity was normal. There is no increase in right ventricular wall thickness. Right ventricular systolic pressure is moderately elevated with an estimated pressure of 46.5 mmHg.  3. Left atrial size was mildly dilated.  4. Mild stenosis of the aortic valve. Degree of stenosis may be underestimated secondary to depressed EF.  5. Mitral valve regurgitation is moderate to severe  6. Tricuspid valve regurgitation is moderate.  7. Left pleural effusion noted   Laboratory Data:  Chemistry Recent Labs  Lab 09/27/18 0012  NA 135  K 3.8  CL 106  CO2 14*  GLUCOSE 275*  BUN 8  CREATININE 0.83  CALCIUM 7.9*  GFRNONAA >60  GFRAA >60  ANIONGAP 15    No results for input(s): PROT, ALBUMIN, AST, ALT, ALKPHOS, BILITOT in the last 168 hours. Hematology Recent Labs  Lab 09/27/18 0132 09/27/18 0640  WBC 7.1  --   RBC 1.81*  --   HGB 2.6* 6.3*  HCT 10.4* 21.1*  MCV 57.5*  --   MCH 14.4*  --   MCHC 25.0*  --   RDW 24.5*  --   PLT 311  --    Cardiac Enzymes Recent Labs  Lab 09/27/18 0012 09/27/18 0640  TROPONINI 0.04* 0.04*   No results for input(s): TROPIPOC in the last 168 hours.  BNP Recent Labs  Lab 09/27/18 0613  BNP 1,456.0*    DDimer No  results for input(s): DDIMER in the last 168  hours.  Radiology/Studies:  Dg Chest 2 View  Result Date: 09/27/2018 CLINICAL DATA:  Increasing shortness of breath, wheezing, and fever. EXAM: CHEST - 2 VIEW COMPARISON:  CT chest 09/30/2016. Chest 03/31/2016. FINDINGS: Cardiac enlargement with perihilar interstitial infiltration likely representing edema. No focal consolidation. Small bilateral pleural effusions. Probable pleural calcification on the right. No pneumothorax. Mediastinal contours appear intact. IMPRESSION: Cardiac enlargement with perihilar interstitial edema and small bilateral pleural effusions. Electronically Signed   By: Lucienne Capers M.D.   On: 09/27/2018 01:05    Assessment and Plan:   New Onset Acute HFrEF  - SOB. No previous h/o HF or cardiac disease. CXR as above showing cardiac enlargement. BNP elevated at 1456.0. Previous CT chest showing thoracic aortic atherosclerosis and also emphysema/COPD with pulmonary nodule, heart size normal. Current smoker; severe anemia s/p transfusion x1., which is also likely contributing to SOB. - Echo as above with severely reduced EF 25-30%, hypokinesis without RWMA. Bilateral pleural effusions with L>R. S/p IV lasix x1. Severe bilateral wheezing on exam. Elevated pulmonary pressures. - Will need to balance diuresis with severe anemia and low output HF. As below, s/p transfusion with Hgb increase and will likely need another transfusion. Continue to monitor O2 saturation. Continue O2 / re-breather and nebs as needed. Consider repeat echo in ~3 months once improvement in Hgb and to reassess EF and EF.  Acute Respiratory failure / hypoxia - As above, h/o COPD / emphysema with severe anemia and low output HFrEF with echo as above. Degree of AS unknown at this time but could also contribute to SOB. Current smoker. Bipap/Cpap/O2 as needed and per CCM. Monitor vitals. Remainder of medical management as above with note of caution with diuresis.   Aortic Stenosis - As in echo report above,  difficult to assess in setting of low output HFrEF. Will need to reassess - consider repeat echo in ~3 months.  Severe anemia with h/o GERD and GI bleed - Hgb 2.9  6.3 s/p transfusion x1. H/o GI bleed without current report of melena, hematuria, hematochezia. Iron study recommended. Recommendation for repeat transfusion given hemoglobin remains below 8. Daily CBC. Continue PPI. Per EMR, FOBT pending and hematology consulted.   Elevated troponin - No CP. Tn minimally elevated at 0.04. EKG without acute changes. Consider supply demand ischemia in the setting of severe anemia and respiratory distress/hypoxia, as well as tachycardic on presentation. Continue to cycle until peaked and down-trending.  - No further ischemic workup recommended at this time and in the setting of severe anemia as above.  HTN - Controlled. PTA losartan not continued at admission. Recommend restart of losartan as renal function stable on review of labs. Received IV lasix. As above, will need to balance lasix with severely low output HF. Recommend daily BMET, CBC.  Hypothyroid - Continue synthroid. TSH 2.3.  For questions or updates, please contact [facility] Please consult [email or web address] for contact info under     Signed, Arvil Chaco, PA-C  09/27/2018 10:45 AM

## 2018-09-27 NOTE — Consult Note (Signed)
[facility] NOTE  Patient Care Team: Lavera Guise, MD as PCP - General (Internal Medicine) Rockey Situ Kathlene November, MD as PCP - Cardiology (Cardiology)  CHIEF COMPLAINTS/PURPOSE OF CONSULTATION:  Severe iron deficiency anemia  HISTORY OF PRESENTING ILLNESS: Please note patient is a limited historian given acute respiratory distress.  No family by the bedside. Dawn Carpenter 73 y.o.  female with prior history of iron deficiency-is currently admitted to hospital for worsening shortness of breath and congestive heart failure.   Patient admission noted to have hemoglobin of 2.6 with severe microcytosis with MCV 57. Patient appropriately received 2 unit of PRBC transfusion-hemoglobin currently 6.3.  Patient is currently on BiPAP given congestive heart failure.  Patient denies any blood in urine/blood in stools or black-colored stools.  Patient states that she has been taking iron pills in the past.  Patient states that she had EGD/colonoscopy approximately 4 years ago.  She denied vaginal bleeding.  Review of Systems  Unable to perform ROS: Severe respiratory distress     MEDICAL HISTORY:  Past Medical History:  Diagnosis Date  . Anemia   . Anxiety   . Arthritis    rheumatoid  . Depression   . GERD (gastroesophageal reflux disease)   . Hemorrhoids   . Hypertension   . Osteoporosis   . Thyroid disease    hypothyoridism    SURGICAL HISTORY: Past Surgical History:  Procedure Laterality Date  . ABDOMINAL HYSTERECTOMY    . COLONOSCOPY    . COLONOSCOPY WITH PROPOFOL N/A 05/15/2015   Procedure: COLONOSCOPY WITH PROPOFOL;  Surgeon: Manya Silvas, MD;  Location: [facility] ENDOSCOPY;  Service: Endoscopy;  Laterality: N/A;  . ESOPHAGOGASTRODUODENOSCOPY (EGD) WITH PROPOFOL N/A 05/15/2015   Procedure: ESOPHAGOGASTRODUODENOSCOPY (EGD) WITH PROPOFOL;  Surgeon: Manya Silvas, MD;  Location: [facility] ENDOSCOPY;  Service: Endoscopy;  Laterality: N/A;  . HAND SURGERY     for  arthritis  . UPPER GI ENDOSCOPY      SOCIAL HISTORY: Social History   Socioeconomic History  . Marital status: Single    Spouse name: Not on file  . Number of children: Not on file  . Years of education: Not on file  . Highest education level: Not on file  Occupational History  . Not on file  Social Needs  . Financial resource strain: Not on file  . Food insecurity:    Worry: Not on file    Inability: Not on file  . Transportation needs:    Medical: Not on file    Non-medical: Not on file  Tobacco Use  . Smoking status: Current Every Day Smoker    Packs/day: 0.50    Years: 40.00    Pack years: 20.00    Types: Cigarettes  . Smokeless tobacco: Never Used  . Tobacco comment: Hx of 1 PPD from 73 years old until 01/2016  Substance and Sexual Activity  . Alcohol use: No    Frequency: Never  . Drug use: No  . Sexual activity: Not on file  Lifestyle  . Physical activity:    Days per week: Not on file    Minutes per session: Not on file  . Stress: Not on file  Relationships  . Social connections:    Talks on phone: Not on file    Gets together: Not on file    Attends religious service: Not on file    Active member of club or organization: Not on file    Attends meetings of clubs or  organizations: Not on file    Relationship status: Not on file  . Intimate partner violence:    Fear of current or ex partner: Not on file    Emotionally abused: Not on file    Physically abused: Not on file    Forced sexual activity: Not on file  Other Topics Concern  . Not on file  Social History Narrative  . Not on file    FAMILY HISTORY: Family History  Problem Relation Age of Onset  . Colon cancer Father   . Heart disease Father   . Hypertension Mother   . Breast cancer Neg Hx     ALLERGIES:  is allergic to wellbutrin [bupropion].  MEDICATIONS:  Current Facility-Administered Medications  Medication Dose Route Frequency Provider Last Rate Last Dose  . acetaminophen  (TYLENOL) tablet 650 mg  650 mg Oral Q6H PRN Harrie Foreman, MD   650 mg at 09/27/18 1718   Or  . acetaminophen (TYLENOL) suppository 650 mg  650 mg Rectal Q6H PRN Harrie Foreman, MD      . budesonide (PULMICORT) nebulizer solution 0.5 mg  0.5 mg Nebulization BID Flora Lipps, MD      . carvedilol (COREG) tablet 6.25 mg  6.25 mg Oral BID WC Gollan, Kathlene November, MD      . docusate sodium (COLACE) capsule 100 mg  100 mg Oral BID Harrie Foreman, MD   Stopped at 09/27/18 1143  . furosemide (LASIX) injection 20 mg  20 mg Intravenous Q12H Gladstone Lighter, MD   20 mg at 09/27/18 1751  . ipratropium (ATROVENT) nebulizer solution 0.5 mg  0.5 mg Nebulization Q6H PRN Darel Hong D, NP   0.5 mg at 09/27/18 0729  . ipratropium-albuterol (DUONEB) 0.5-2.5 (3) MG/3ML nebulizer solution 3 mL  3 mL Nebulization Q4H Flora Lipps, MD   3 mL at 09/27/18 1527  . levalbuterol (XOPENEX) nebulizer solution 1.25 mg  1.25 mg Nebulization Q6H PRN Darel Hong D, NP   1.25 mg at 09/27/18 1116  . levothyroxine (SYNTHROID, LEVOTHROID) tablet 50 mcg  50 mcg Oral QAC breakfast Harrie Foreman, MD   Stopped at 09/27/18 1143  . losartan (COZAAR) tablet 25 mg  25 mg Oral Daily Gollan, Kathlene November, MD      . methylPREDNISolone sodium succinate (SOLU-MEDROL) 40 mg/mL injection 40 mg  40 mg Intravenous Q12H Flora Lipps, MD      . ondansetron (ZOFRAN) tablet 4 mg  4 mg Oral Q6H PRN Harrie Foreman, MD       Or  . ondansetron [facility]) injection 4 mg  4 mg Intravenous Q6H PRN Harrie Foreman, MD      . pantoprazole (PROTONIX) injection 40 mg  40 mg Intravenous Q12H Darel Hong D, NP          .  PHYSICAL EXAMINATION:  Vitals:   09/27/18 1721 09/27/18 1900  BP: (!) 153/89 (!) 141/97  Pulse: 97 78  Resp: (!) 30 (!) 34  Temp: 97.7 F (36.5 C) 97.7 F (36.5 C)  SpO2: 96% 100%   Filed Weights   09/27/18 0008 09/27/18 0630  Weight: 107 lb (48.5 kg) 108 lb 14.5 oz (49.4 kg)    Physical Exam   Constitutional: She is oriented to person, place, and time.  Frail-appearing Caucasian female patient.  Resting in the bed.  On BiPAP.  HENT:  Head: Normocephalic and atraumatic.  Mouth/Throat: Oropharynx is clear and moist. No oropharyngeal exudate.  Eyes: Pupils are equal, round, and  reactive to light.  Neck: Normal range of motion. Neck supple.  Cardiovascular: Normal rate and regular rhythm.  Pulmonary/Chest: No respiratory distress. She has no wheezes.  Bilateral coarse breath sounds crackles.  Abdominal: Soft. Bowel sounds are normal. She exhibits no distension and no mass. There is no abdominal tenderness. There is no rebound and no guarding.  Musculoskeletal: Normal range of motion.        General: No tenderness or edema.  Neurological: She is alert and oriented to person, place, and time.  Skin: Skin is warm.  Psychiatric: Affect normal.     LABORATORY DATA:  I have reviewed the data as listed Lab Results  Component Value Date   WBC 7.1 09/27/2018   HGB 6.3 (L) 09/27/2018   HCT 21.1 (L) 09/27/2018   MCV 57.5 (L) 09/27/2018   PLT 311 09/27/2018   Recent Labs    09/27/18 0012  NA 135  K 3.8  CL 106  CO2 14*  GLUCOSE 275*  BUN 8  CREATININE 0.83  CALCIUM 7.9*  GFRNONAA >60  GFRAA >60    RADIOGRAPHIC STUDIES: I have personally reviewed the radiological images as listed and agreed with the findings in the report. Dg Chest 2 View  Result Date: 09/27/2018 CLINICAL DATA:  Increasing shortness of breath, wheezing, and fever. EXAM: CHEST - 2 VIEW COMPARISON:  CT chest 09/30/2016. Chest 03/31/2016. FINDINGS: Cardiac enlargement with perihilar interstitial infiltration likely representing edema. No focal consolidation. Small bilateral pleural effusions. Probable pleural calcification on the right. No pneumothorax. Mediastinal contours appear intact. IMPRESSION: Cardiac enlargement with perihilar interstitial edema and small bilateral pleural effusions. Electronically  Signed   By: Lucienne Capers M.D.   On: 09/27/2018 01:05    Symptomatic anemia 73 year old female patient with prior history of iron deficient anemia is currently admitted to hospital for severe anemia/acute congestive heart failure/respiratory failure  #Severe symptomatic iron deficiency anemia hemoglobin at presentation 2.6; currently status post units of PRBC transfusion hemoglobin up to 6.  Continue PRBC transfusion to keep hemoglobin around 9-10 especially with the congestive heart failure/cardiac decompensation.  Also plan IV iron once patient stabilizes.  #Etiology of iron deficiency-i seems to be chronic given the severe microcytosis.  GI causes have been ruled out/check urine analysis and stool occult.  #Acute respiratory failure/acute congestive heart failure ejection fraction 25 to 30%.  Defer to primary team/cardiology.  Thank you Dr. Tressia Miners for allowing me to participate in the care of your pleasant patient. Please do not hesitate to contact me with questions or concerns in the interim.  Discussed with Dr. Tressia Miners.  Also spoke to patient's daughter over the phone updated about the clinical status plan of care.     All questions were answered. The patient knows to call the clinic with any problems, questions or concerns.    Cammie Sickle, MD 09/27/2018 7:50 PM

## 2018-09-27 NOTE — Progress Notes (Signed)
[place] [facility] [place] NAME: Dawn Carpenter    MR#:  [id number]  DATE OF BIRTH:  1945/08/21  SUBJECTIVE:  CHIEF COMPLAINT:   Chief Complaint  Patient presents with  . Shortness of Breath   -Patient came in with shortness of breath and was noted to have a hemoglobin of 2.6.  Received 2 units transfusion and now hemoglobin at 6 -Also significant dyspnea and CHF-on BiPAP  REVIEW OF SYSTEMS:  Review of Systems  Constitutional: Positive for diaphoresis and malaise/fatigue. Negative for chills and fever.  HENT: Negative for congestion, ear discharge, hearing loss and nosebleeds.   Eyes: Negative for blurred vision and double vision.  Respiratory: Positive for shortness of breath. Negative for cough and wheezing.   Cardiovascular: Positive for chest pain. Negative for palpitations and leg swelling.  Gastrointestinal: Negative for abdominal pain, constipation, diarrhea, nausea and vomiting.  Genitourinary: Negative for dysuria.  Musculoskeletal: Negative for myalgias.  Neurological: Negative for dizziness, focal weakness, seizures, weakness and headaches.  Psychiatric/Behavioral: Negative for depression.    DRUG ALLERGIES:   Allergies  Allergen Reactions  . Wellbutrin [Bupropion] Other (See Comments)    Generic form.    VITALS:  Blood pressure 130/81, pulse 82, temperature (!) 96 F (35.6 C), temperature source Axillary, resp. rate 19, height 5\' 4"  (1.626 m), weight 49.4 kg, SpO2 98 %.  PHYSICAL EXAMINATION:  Physical Exam   GENERAL:  73 y.o.-year-old patient lying in the bed, on BiPAP.  EYES: Pupils equal, round, reactive to light and accommodation. No scleral icterus. Extraocular muscles intact.  HEENT: Head atraumatic, normocephalic. Oropharynx and nasopharynx clear.  NECK:  Supple, no jugular venous distention. No thyroid enlargement, no tenderness.  LUNGS: Normal breath sounds bilaterally, no wheezing, rhonchi or crepitation.  No use of accessory muscles of respiration.  Bibasilar crackles noted CARDIOVASCULAR: S1, S2 normal. No  rubs, or gallops.  2/6 systolic murmur is ABDOMEN: Soft, nontender, nondistended. Bowel sounds present. No organomegaly or mass.  EXTREMITIES: No pedal edema, cyanosis, or clubbing.  NEUROLOGIC: Cranial nerves II through XII are intact. Muscle strength 5/5 in all extremities. Sensation intact. Gait not checked.  Global weakness PSYCHIATRIC: The patient is alert and oriented x 3.  SKIN: No obvious rash, lesion, or ulcer.    LABORATORY PANEL:   CBC Recent Labs  Lab 09/27/18 0132 09/27/18 0640  WBC 7.1  --   HGB 2.6* 6.3*  HCT 10.4* 21.1*  PLT 311  --    ------------------------------------------------------------------------------------------------------------------  Chemistries  Recent Labs  Lab 09/27/18 0012  NA 135  K 3.8  CL 106  CO2 14*  GLUCOSE 275*  BUN 8  CREATININE 0.83  CALCIUM 7.9*   ------------------------------------------------------------------------------------------------------------------  Cardiac Enzymes Recent Labs  Lab 09/27/18 0640  TROPONINI 0.04*   ------------------------------------------------------------------------------------------------------------------  RADIOLOGY:  Dg Chest 2 View  Result Date: 09/27/2018 CLINICAL DATA:  Increasing shortness of breath, wheezing, and fever. EXAM: CHEST - 2 VIEW COMPARISON:  CT chest 09/30/2016. Chest 03/31/2016. FINDINGS: Cardiac enlargement with perihilar interstitial infiltration likely representing edema. No focal consolidation. Small bilateral pleural effusions. Probable pleural calcification on the right. No pneumothorax. Mediastinal contours appear intact. IMPRESSION: Cardiac enlargement with perihilar interstitial edema and small bilateral pleural effusions. Electronically Signed   By: Lucienne Capers M.D.   On: 09/27/2018 01:05    EKG:   Orders placed or performed in visit on 02/27/04    . EKG 12-Lead    ASSESSMENT AND PLAN:   73 year old female with  past medical history significant for GERD, hypertension, osteoporosis and arthritis, anemia of chronic disease presents to hospital secondary to worsening shortness of breath over few weeks now.  1.  Acute on chronic anemia-denies any active bleeding.  Hemoglobin was noted to be at 2.6 on admission. -hemoglobin was 11 in 2017 -Anemia panel ordered, significantly low ferritin levels.  With normal iron levels not sure if it was drawn after blood transfusion. -Patient received 2 units packed RBC transfusion and hemoglobin is 6.3.  Recheck hemoglobin every 8 hours -Hematology has been consulted.  IV iron if needed -Consider GI consult  2.  Acute respiratory failure secondary to hypoxia-secondary to acute CHF exacerbation. -Currently on BiPAP, not on home oxygen.  Elevated BNP and also chest x-ray with significant pulmonary edema -Could have been triggered by anemia -Cardiogram has been done -Continue IV Lasix -Wean off BiPAP as tolerated.  Cardiology has been consulted -Also has underlying COPD.  3.  Hypertension-on IV Lasix.  Restart losartan  4.Hypothyroidism- synthroid  5. DVT prophylaxis- TEDs and SCDs only  Independent at baseline PT consult once stable     All the records are reviewed and case discussed with Care Management/Social Workerr. Management plans discussed with the patient, family and they are in agreement.  CODE STATUS: Full code  TOTAL TIME TAKING CARE OF THIS PATIENT: 36 minutes.   POSSIBLE D/C IN 2-3 DAYS, DEPENDING ON CLINICAL CONDITION.   Gladstone Lighter M.D on 09/27/2018 at 12:22 PM  Between 7am to 6pm - Pager - [phone]  After 6pm go to [email or web address] - password [email or web address] [facility]  Office  [phone]  CC: Primary care physician; Lavera Guise, MD

## 2018-09-27 NOTE — H&P (Signed)
Dawn Carpenter is an 73 y.o. female.   Chief Complaint: Shortness of breath HPI: The patient with past medical history of hypertension, hypothyroidism, GERD and GI bleeding presents to the emergency department complaining of shortness of breath.  She also complains of shortness of breath at times that is usually associated with movement and elevated heart rate.  Otherwise she is chest pain-free.  The patient states that she has had dyspnea on exertion for approximately 1 week.  She also states that she has felt weak for the past 3 days and admits to having no appetite for the last 2 days.  The patient denies seeing blood in her stool.  She also denies hematemesis, nausea, vomiting or diarrhea.  Laboratory evaluation revealed hemoglobin of 2.6 with normal electrolytes and renal function.  This x-ray showed some pulmonary edema for which the patient received Lasix.  She remained hemodynamically stable throughout the emergency department stay.  Transfusion of red blood cells was started prior to the emergency department staff calling the hospitalist service for admission.  Past Medical History:  Diagnosis Date  . Anemia   . Anxiety   . Arthritis    rheumatoid  . Depression   . GERD (gastroesophageal reflux disease)   . Hemorrhoids   . Hypertension   . Osteoporosis   . Thyroid disease    hypothyoridism    Past Surgical History:  Procedure Laterality Date  . ABDOMINAL HYSTERECTOMY    . COLONOSCOPY    . COLONOSCOPY WITH PROPOFOL N/A 05/15/2015   Procedure: COLONOSCOPY WITH PROPOFOL;  Surgeon: Manya Silvas, MD;  Location: [facility] ENDOSCOPY;  Service: Endoscopy;  Laterality: N/A;  . ESOPHAGOGASTRODUODENOSCOPY (EGD) WITH PROPOFOL N/A 05/15/2015   Procedure: ESOPHAGOGASTRODUODENOSCOPY (EGD) WITH PROPOFOL;  Surgeon: Manya Silvas, MD;  Location: [facility] ENDOSCOPY;  Service: Endoscopy;  Laterality: N/A;  . HAND SURGERY     for arthritis  . UPPER GI ENDOSCOPY      Family History  Problem  Relation Age of Onset  . Colon cancer Father   . Heart disease Father   . Breast cancer Neg Hx    Social History:  reports that she has been smoking cigarettes. She has a 20.00 pack-year smoking history. She has never used smokeless tobacco. She reports that she does not drink alcohol or use drugs.  Allergies:  Allergies  Allergen Reactions  . Wellbutrin [Bupropion]     Generic form.    Medications Prior to Admission  Medication Sig Dispense Refill  . ALPRAZolam (XANAX) 0.25 MG tablet Take 0.5 tablets (0.125 mg total) by mouth 2 (two) times daily as needed. 30 tablet 3  . Biotin 1 MG CAPS Take 1 tablet by mouth daily.    . calcium carbonate (TUMS - DOSED IN MG ELEMENTAL CALCIUM) 500 MG chewable tablet Chew 1 tablet by mouth daily.    Marland Kitchen escitalopram (LEXAPRO) 10 MG tablet Take 1 tablet (10 mg total) by mouth 2 (two) times daily. 60 tablet 5  . Ferrous Fumarate-Folic Acid (HEMATINIC/FOLIC ACID) 403-4 MG TABS Take 1 tablet by mouth daily.     Marland Kitchen levothyroxine (SYNTHROID, LEVOTHROID) 50 MCG tablet Take 1 tablet (50 mcg total) by mouth daily before breakfast. 30 tablet 5  . losartan (COZAAR) 50 MG tablet Take 1 tablet (50 mg total) by mouth daily. 30 tablet 5  . mirtazapine (REMERON) 30 MG tablet Take 1 tablet (30 mg total) by mouth at bedtime. 30 tablet 5  . Multiple Vitamin (MULTIVITAMIN) capsule Take 1 capsule by mouth  daily.    . omeprazole (PRILOSEC) 40 MG capsule Take 1 capsule (40 mg total) by mouth daily. 30 capsule 5    Results for orders placed or performed during the hospital encounter of 09/27/18 (from the past 48 hour(s))  Basic metabolic panel     Status: Abnormal   Collection Time: 09/27/18 12:12 AM  Result Value Ref Range   Sodium 135 135 - 145 mmol/L   Potassium 3.8 3.5 - 5.1 mmol/L   Chloride 106 98 - 111 mmol/L   CO2 14 (L) 22 - 32 mmol/L   Glucose, Bld 275 (H) 70 - 99 mg/dL   BUN 8 8 - 23 mg/dL   Creatinine, Ser 0.83 0.44 - 1.00 mg/dL   Calcium 7.9 (L) 8.9 - 10.3  mg/dL   GFR calc non Af Amer >60 >60 mL/min   GFR calc Af Amer >60 >60 mL/min   Anion gap 15 5 - 15    Comment: Performed at [facility], [address] [place], [place] [address]  Influenza panel by PCR (type A & B)     Status: None   Collection Time: 09/27/18 12:12 AM  Result Value Ref Range   Influenza A By PCR NEGATIVE NEGATIVE   Influenza B By PCR NEGATIVE NEGATIVE    Comment: (NOTE) The Xpert Xpress Flu assay is intended as an aid in the diagnosis of  influenza and should not be used as a sole basis for treatment.  This  assay is FDA approved for nasopharyngeal swab specimens only. Nasal  washings and aspirates are unacceptable for Xpert Xpress Flu testing. Performed at [facility], [address] [place], [place] [address]   Troponin I - Add-On to previous collection     Status: Abnormal   Collection Time: 09/27/18 12:12 AM  Result Value Ref Range   Troponin I 0.04 (HH) <0.03 ng/mL    Comment: CRITICAL RESULT CALLED TO, READ BACK BY AND VERIFIED WITH Kynadie FOUST @0215  09/27/18 AKT Performed at [facility], [address] [place],  [address]   CBC with Differential/Platelet     Status: Abnormal   Collection Time: 09/27/18  1:32 AM  Result Value Ref Range   WBC 7.1 4.0 - 10.5 K/uL   RBC 1.81 (L) 3.87 - 5.11 MIL/uL   Hemoglobin 2.6 (LL) 12.0 - 15.0 g/dL    Comment: Reticulocyte Hemoglobin testing may be clinically indicated, consider ordering this additional test [id number] THIS CRITICAL RESULT HAS VERIFIED AND BEEN CALLED TO KATIE FOUST BY AMBER TRENT ON 03 10 2020 AT 0227, AND HAS BEEN READ BACK. CALLED @0158  [id number] AKT THIS CRITICAL RESULT HAS VERIFIED AND BEEN CALLED TO KATIE FOUST BY AMBER TRENT ON 03 10 2020 AT 0228, AND HAS BEEN READ BACK. CALLED @0158  31020 AKT    HCT 10.4 (LL) 36.0 - 46.0 %    Comment: This critical result has verified and been called to [place] by [employer] on 03 10 2020 at 0227, and has been read  back. CALLED @0158  [id number] AKT This critical result has verified and been called to [place] by [employer] on 03 10 2020 at 0228, and has been read back. CALLED @0158  31020 AKT    MCV 57.5 (L) 80.0 - 100.0 fL   MCH 14.4 (L) 26.0 - 34.0 pg   MCHC 25.0 (L) 30.0 - 36.0 g/dL   RDW 24.5 (H) 11.5 - 15.5 %   Platelets 311 150 - 400 K/uL   nRBC  0.0 0.0 - 0.2 %   Neutrophils Relative % 85 %   Neutro Abs 6.0 1.7 - 7.7 K/uL   Lymphocytes Relative 6 %   Lymphs Abs 0.4 (L) 0.7 - 4.0 K/uL   Monocytes Relative 8 %   Monocytes Absolute 0.6 0.1 - 1.0 K/uL   Eosinophils Relative 0 %   Eosinophils Absolute 0.0 0.0 - 0.5 K/uL   Basophils Relative 0 %   Basophils Absolute 0.0 0.0 - 0.1 K/uL   Immature Granulocytes 1 %   Abs Immature Granulocytes 0.04 0.00 - 0.07 K/uL   Target Cells PRESENT    Spherocytes PRESENT     Comment: Performed at [facility], [address] [place], [place] [address]  ABO/Rh     Status: None   Collection Time: 09/27/18  1:32 AM  Result Value Ref Range   ABO/RH(D)      A NEG Performed at [facility], [address] [place], [place] [address]   Type and screen [place]     Status: None (Preliminary result)   Collection Time: 09/27/18  2:20 AM  Result Value Ref Range   ABO/RH(D) PENDING    Antibody Screen PENDING    Sample Expiration      09/30/2018 Performed at [facility], [address] [place], [place] [address]    Dg Chest 2 View  Result Date: 09/27/2018 CLINICAL DATA:  Increasing shortness of breath, wheezing, and fever. EXAM: CHEST - 2 VIEW COMPARISON:  CT chest 09/30/2016. Chest 03/31/2016. FINDINGS: Cardiac enlargement with perihilar interstitial infiltration likely representing edema. No focal consolidation. Small bilateral pleural effusions. Probable pleural calcification on the right. No pneumothorax. Mediastinal contours appear intact. IMPRESSION: Cardiac enlargement with perihilar interstitial  edema and small bilateral pleural effusions. Electronically Signed   By: Lucienne Capers M.D.   On: 09/27/2018 01:05    Review of Systems  Constitutional: Negative for chills and fever.  HENT: Negative for sore throat and tinnitus.   Eyes: Negative for blurred vision and redness.  Respiratory: Positive for shortness of breath. Negative for cough.   Cardiovascular: Positive for chest pain. Negative for palpitations, orthopnea and PND.  Gastrointestinal: Negative for abdominal pain, diarrhea, nausea and vomiting.  Genitourinary: Negative for dysuria, frequency and urgency.  Musculoskeletal: Negative for joint pain and myalgias.  Skin: Negative for rash.       No lesions  Neurological: Negative for speech change, focal weakness and weakness.  Endo/Heme/Allergies: Does not bruise/bleed easily.       No temperature intolerance  Psychiatric/Behavioral: Negative for depression and suicidal ideas.    Temperature 97.6 F (36.4 C), height 5\' 4"  (1.626 m), weight 48.5 kg. Physical Exam  Vitals reviewed. Constitutional: She is oriented to person, place, and time. She appears well-developed and well-nourished. No distress.  HENT:  Head: Normocephalic and atraumatic.  Mouth/Throat: Oropharynx is clear and moist.  Eyes: Pupils are equal, round, and reactive to light. Conjunctivae and EOM are normal. No scleral icterus.  Neck: Normal range of motion. Neck supple. No JVD present. No tracheal deviation present. No thyromegaly present.  Cardiovascular: Normal rate, regular rhythm and normal heart sounds. Exam reveals no gallop and no friction rub.  No murmur heard. Respiratory: Effort normal. She has wheezes.  GI: Soft. Bowel sounds are normal. She exhibits no distension. There is no abdominal tenderness.  Genitourinary:    Genitourinary Comments: Deferred   Musculoskeletal: Normal range of motion.        General: No  edema.  Lymphadenopathy:    She has no cervical adenopathy.  Neurological: She  is alert and oriented to person, place, and time. No cranial nerve deficit. She exhibits normal muscle tone.  Skin: Skin is warm and dry. No rash noted. No erythema. There is pallor.  Psychiatric: She has a normal mood and affect. Her behavior is normal. Judgment and thought content normal.     Assessment/Plan This is a 73 year old female admitted for acute CHF. 1.  CHF: Acute; systolic high-output failure.  Secondary to severe anemia.  Continue Lasix as needed.  Stroke-volume should improve as blood volume/oxygen carrying capacity increases.  Obtain echocardiogram if patient still has clinical symptoms of CHF following volume resuscitation. 2.  Anemia: Microcytic with MCV of 57 and RDW of 24.  Other cell lines normal.  Obtain iron studies.  History of GI bleed in the past although the patient does not have signs or symptoms of current GI bleed.  Consult hematology for further guidance. 3.  Hypertension: Controlled; resume home regimen antihypertensive medication once reviewed and verified by pharmacy 4.  Hypothyroidism: TSH found to be elevated; start Synthroid 5.  Chest pain: Atypical; secondary to supply ischemia.  Troponin negative and no EKG changes. 6.  DVT prophylaxis: SCDs 7.  GI prophylaxis: Pantoprazole per home regimen  Harrie Foreman, MD 09/27/2018, 3:20 AM

## 2018-09-27 NOTE — Assessment & Plan Note (Addendum)
73 year old female patient with prior history of iron deficient anemia is currently admitted to hospital for severe anemia/acute congestive heart failure/respiratory failure  #Severe symptomatic iron deficiency anemia hemoglobin at presentation 2.6; status post multiple runs of blood transfusion hemoglobin stable at 9.3.  Proceed with IV iron infusion.  #Etiology of iron deficiency-unclear at this time.  Recommend CT of the abdomen pelvis with contrast to rule out obvious malignancies.  Given this significant weight loss.  #Acute respiratory failure/acute congestive heart failure ejection fraction 25 to 30%.  Improving.  Discussed with the [facility].  Dr. Janese Banks on call over the weekend for any immediate concerns.

## 2018-09-27 NOTE — ED Provider Notes (Signed)
[facility] Emergency Department Provider Note  ____________________________________________  Time seen: Approximately 2:40 AM  I have reviewed the triage vital signs and the nursing notes.   HISTORY  Chief Complaint Shortness of Breath    HPI Dawn Carpenter is a 73 y.o. female with a past medical history of GERD hypertension and anemia and GI bleed who comes the ED complaining of shortness of breath that started tonight.   Patient also complains of nonproductive cough.  Denies peripheral edema or history of heart failure.  Denies fevers chills or body aches.  EMS report initial oxygen saturation was 80%.  They gave 1 DuoNeb with improvement to 100%.     Past Medical History:  Diagnosis Date  . Anemia   . Anxiety   . Arthritis    rheumatoid  . Depression   . GERD (gastroesophageal reflux disease)   . Hemorrhoids   . Hypertension   . Osteoporosis   . Thyroid disease    hypothyoridism     Patient Active Problem List   Diagnosis Date Noted  . Acute systolic heart failure ([place]) 09/27/2018  . Screening for breast cancer 05/18/2018  . Need for vaccination against Streptococcus pneumoniae using pneumococcal conjugate vaccine 13 05/18/2018  . Dysuria 05/18/2018  . Essential hypertension 10/06/2017  . Acquired hypothyroidism 10/06/2017  . Moderate major depression ([place]) 10/06/2017  . Generalized anxiety disorder 10/06/2017  . Personal history of tobacco use, presenting hazards to health 01/23/2016  . Gastroesophageal reflux disease 03/08/2015  . Heme positive stool 03/08/2015  . Iron deficiency anemia, unspecified 03/08/2015     Past Surgical History:  Procedure Laterality Date  . ABDOMINAL HYSTERECTOMY    . COLONOSCOPY    . COLONOSCOPY WITH PROPOFOL N/A 05/15/2015   Procedure: COLONOSCOPY WITH PROPOFOL;  Surgeon: Manya Silvas, MD;  Location: [facility] ENDOSCOPY;  Service: Endoscopy;  Laterality: N/A;  . ESOPHAGOGASTRODUODENOSCOPY (EGD)  WITH PROPOFOL N/A 05/15/2015   Procedure: ESOPHAGOGASTRODUODENOSCOPY (EGD) WITH PROPOFOL;  Surgeon: Manya Silvas, MD;  Location: [facility] ENDOSCOPY;  Service: Endoscopy;  Laterality: N/A;  . HAND SURGERY     for arthritis  . UPPER GI ENDOSCOPY       Prior to Admission medications   Medication Sig Start Date End Date Taking? Authorizing Provider  ALPRAZolam (XANAX) 0.25 MG tablet Take 0.5 tablets (0.125 mg total) by mouth 2 (two) times daily as needed. 05/17/18   Ronnell Freshwater, NP  Biotin 1 MG CAPS Take 1 tablet by mouth daily.    [provider]  calcium carbonate (TUMS - DOSED IN MG ELEMENTAL CALCIUM) 500 MG chewable tablet Chew 1 tablet by mouth daily.    [provider]  escitalopram (LEXAPRO) 10 MG tablet Take 1 tablet (10 mg total) by mouth 2 (two) times daily. 05/17/18   Ronnell Freshwater, NP  Ferrous Fumarate-Folic Acid (HEMATINIC/FOLIC ACID) 993-7 MG TABS Take 1 tablet by mouth daily.    [provider]  levothyroxine (SYNTHROID, LEVOTHROID) 50 MCG tablet Take 1 tablet (50 mcg total) by mouth daily before breakfast. 05/17/18   Ronnell Freshwater, NP  losartan (COZAAR) 50 MG tablet Take 1 tablet (50 mg total) by mouth daily. 08/26/18   Ronnell Freshwater, NP  mirtazapine (REMERON) 30 MG tablet Take 1 tablet (30 mg total) by mouth at bedtime. 08/26/18   Ronnell Freshwater, NP  Multiple Vitamin (MULTIVITAMIN) capsule Take 1 capsule by mouth daily.    [provider]  omeprazole (PRILOSEC) 40 MG capsule Take  1 capsule (40 mg total) by mouth daily. 05/17/18   Ronnell Freshwater, NP     Allergies Wellbutrin [bupropion]   Family History  Problem Relation Age of Onset  . Colon cancer Father   . Heart disease Father   . Breast cancer Neg Hx     Social History Social History   Tobacco Use  . Smoking status: Current Every Day Smoker    Packs/day: 0.50    Years: 40.00    Pack years: 20.00    Types: Cigarettes  . Smokeless tobacco: Never Used  .  Tobacco comment: Hx of 1 PPD from 73 years old until 01/2016  Substance Use Topics  . Alcohol use: No    Frequency: Never  . Drug use: No    Review of Systems  Constitutional:   No fever or chills.  ENT:   No sore throat. No rhinorrhea. Cardiovascular:   No chest pain or syncope. Respiratory: Positive shortness of breath and nonproductive cough. Gastrointestinal:   Negative for abdominal pain, vomiting and diarrhea.  Musculoskeletal:   Negative for focal pain or swelling All other systems reviewed and are negative except as documented above in ROS and HPI.  ____________________________________________   PHYSICAL EXAM:  VITAL SIGNS: ED Triage Vitals  Enc Vitals Group     BP --      Pulse --      Resp --      Temp 09/27/18 0137 97.6 F (36.4 C)     Temp src --      SpO2 --      Weight 09/27/18 0008 107 lb (48.5 kg)     Height 09/27/18 0008 5\' 4"  (1.626 m)     Head Circumference --      Peak Flow --      Pain Score 09/27/18 0007 8     Pain Loc --      Pain Edu? --      Excl. in [place]? --     Vital signs reviewed, nursing assessments reviewed.   Constitutional:   Alert and oriented.  Ill-appearing. Eyes:   Conjunctivae are pale. EOMI. PERRL. ENT      Head:   Normocephalic and atraumatic.      Nose:   No congestion/rhinnorhea.       Mouth/Throat:   Dry mucous membranes, no pharyngeal erythema. No peritonsillar mass.       Neck:   No meningismus. Full ROM. Hematological/Lymphatic/Immunilogical:   No cervical lymphadenopathy. Cardiovascular:   RRR. Symmetric bilateral radial and DP pulses.  No murmurs. Cap refill less than 2 seconds. Respiratory: Tachypnea, respiratory rate of 24.  Inspiratory crackles bilateral middle and lower lung fields.  Scant expiratory wheezing bilaterally. Gastrointestinal:   Soft and nontender. Non distended. There is no CVA tenderness.  No rebound, rigidity, or guarding.  Rectal exam reveals no stool or blood in the rectal vault.  Secretions are  Hemoccult negative  Musculoskeletal:   Normal range of motion in all extremities. No joint effusions.  No lower extremity tenderness.  No edema. Neurologic:   Normal speech and language.  Motor grossly intact. No acute focal neurologic deficits are appreciated.  Skin:    Skin is warm, dry and intact. No rash noted.  No petechiae, purpura, or bullae.  ____________________________________________    LABS (pertinent positives/negatives) (all labs ordered are listed, but only abnormal results are displayed) Labs Reviewed  BASIC METABOLIC PANEL - Abnormal; Notable for the following components:      Result  Value   CO2 14 (*)    Glucose, Bld 275 (*)    Calcium 7.9 (*)    All other components within normal limits  CBC WITH DIFFERENTIAL/PLATELET - Abnormal; Notable for the following components:   RBC 1.81 (*)    Hemoglobin 2.6 (*)    HCT 10.4 (*)    MCV 57.5 (*)    MCH 14.4 (*)    MCHC 25.0 (*)    RDW 24.5 (*)    Lymphs Abs 0.4 (*)    All other components within normal limits  TROPONIN I - Abnormal; Notable for the following components:   Troponin I 0.04 (*)    All other components within normal limits  INFLUENZA PANEL BY PCR (TYPE A & B)  CBC WITH DIFFERENTIAL/PLATELET  CBC WITH DIFFERENTIAL/PLATELET  PREPARE RBC (CROSSMATCH)  TYPE AND SCREEN  ABO/RH   ____________________________________________   EKG  Interpreted by me Sinus tachycardia rate 109, normal axis and intervals.  Poor R wave progression.  Normal ST segments and T waves.  ____________________________________________    RADIOLOGY  Dg Chest 2 View  Result Date: 09/27/2018 CLINICAL DATA:  Increasing shortness of breath, wheezing, and fever. EXAM: CHEST - 2 VIEW COMPARISON:  CT chest 09/30/2016. Chest 03/31/2016. FINDINGS: Cardiac enlargement with perihilar interstitial infiltration likely representing edema. No focal consolidation. Small bilateral pleural effusions. Probable pleural calcification on the right.  No pneumothorax. Mediastinal contours appear intact. IMPRESSION: Cardiac enlargement with perihilar interstitial edema and small bilateral pleural effusions. Electronically Signed   By: Lucienne Capers M.D.   On: 09/27/2018 01:05    ____________________________________________   PROCEDURES .Critical Care Performed by: Carrie Mew, MD Authorized by: Carrie Mew, MD   Critical care provider statement:    Critical care time (minutes):  35   Critical care time was exclusive of:  Separately billable procedures and treating other patients   Critical care was necessary to treat or prevent imminent or life-threatening deterioration of the following conditions:  Circulatory failure, shock, cardiac failure and respiratory failure   Critical care was time spent personally by me on the following activities:  Development of treatment plan with patient or surrogate, discussions with consultants, evaluation of patient's response to treatment, examination of patient, obtaining history from patient or surrogate, ordering and performing treatments and interventions, ordering and review of laboratory studies, ordering and review of radiographic studies, pulse oximetry, re-evaluation of patient's condition and review of old charts    ____________________________________________  DIFFERENTIAL DIAGNOSIS   Influenza, pneumonia, pleural effusion, pulmonary edema  CLINICAL IMPRESSION / ASSESSMENT AND PLAN / ED COURSE  Medications ordered in the ED: Medications  0.9 %  sodium chloride infusion (has no administration in time range)  ipratropium-albuterol (DUONEB) 0.5-2.5 (3) MG/3ML nebulizer solution 6 mL (6 mLs Nebulization Given 09/27/18 0015)  methylPREDNISolone sodium succinate (SOLU-MEDROL) 125 mg/2 mL injection 80 mg (80 mg Intravenous Given 09/27/18 0015)  sodium chloride 0.9 % bolus 500 mL (0 mLs Intravenous Stopped 09/27/18 0139)  furosemide (LASIX) injection 40 mg (40 mg Intravenous Given  09/27/18 0219)    Pertinent labs & imaging results that were available during my care of the patient were reviewed by me and considered in my medical decision making (see chart for details).    Patient presents with respiratory distress, abnormal lung exam, acute hypoxic respiratory failure.  Continue bronchodilators, give a dose of steroids while getting a chest x-ray and labs.  Doubt ACS PE dissection or pneumothorax.  Clinical Course as of Sep 27 410  Tue Sep 27, 2018  0144 Chest x-ray consistent with pulmonary edema.  With bilateral crackles on lung exam, I will give IV Lasix and add on a troponin.  She does not have any peripheral edema or symptoms of orthopnea, will need work-up for new onset heart failure   [PS]  0210 Notified from lab that hemoglobin is 2.9.  Confirm that it was drawn off of the fresh straight stick, not downstream from site of fluid infusion.  Patient is very pale.  On further questioning she does endorse orthopnea and dyspnea on exertion for the past few months despite denying any history of heart failure or taking any diuretics.  In addition to diuresis, she will need blood transfusion for her severe anemia.  No evidence of GI bleed.   [PS]    Clinical Course User Index [PS] Carrie Mew, MD     ____________________________________________   FINAL CLINICAL IMPRESSION(S) / ED DIAGNOSES    Final diagnoses:  Acute congestive heart failure, unspecified heart failure type ([place])  Symptomatic anemia  Acute respiratory failure with hypoxia   ED Discharge Orders    None      Portions of this note were generated with dragon dictation software. Dictation errors may occur despite best attempts at proofreading.   Carrie Mew, MD 09/27/18 [id number]

## 2018-09-27 NOTE — Progress Notes (Signed)
   PATIENT NAME: Dawn Carpenter    MR#:  [id number]  DATE OF BIRTH:  [birth year]  SUBJECTIVE:  CHIEF COMPLAINT:   Chief Complaint  Patient presents with  . Shortness of Breath     HPI Admitted to ICU for increased WOB and SOB Acute systolic CHF exacerbation on biPAP HGB 2.6 Very frail Critically ill  REVIEW OF SYSTEMS  PATIENT IS UNABLE TO PROVIDE COMPLETE REVIEW OF SYSTEM S DUE TO SEVERE CRITICAL ILLNESS resp failure  DRUG ALLERGIES:   Allergies  Allergen Reactions  . Wellbutrin [Bupropion] Other (See Comments)    Generic form.    VITALS:  Blood pressure 130/81, pulse 82, temperature (!) 96 F (35.6 C), temperature source Axillary, resp. rate 19, height 5\' 4"  (1.626 m), weight 49.4 kg, SpO2 98 %.  PHYSICAL EXAMINATION:  Physical Exam  PHYSICAL EXAMINATION:  GENERAL:critically ill appearing, +resp distress HEAD: Normocephalic, atraumatic.  EYES: Pupils equal, round, reactive to light.  No scleral icterus.  MOUTH: Moist mucosal membrane. NECK: Supple. No thyromegaly. No nodules. No JVD.  PULMONARY: +rhonchi, +wheezing CARDIOVASCULAR: S1 and S2. Regular rate and rhythm. No murmurs, rubs, or gallops.  GASTROINTESTINAL: Soft, nontender, -distended. No masses. Positive bowel sounds. No hepatosplenomegaly.  MUSCULOSKELETAL: No swelling, clubbing, or edema.  NEUROLOGIC: lethargic but arousable SKIN:intact,warm,dry     LABORATORY PANEL:   CBC Recent Labs  Lab 09/27/18 0132 09/27/18 0640  WBC 7.1  --   HGB 2.6* 6.3*  HCT 10.4* 21.1*  PLT 311  --    ------------------------------------------------------------------------------------------------------------------  Chemistries  Recent Labs  Lab 09/27/18 0012  NA 135  K 3.8  CL 106  CO2 14*  GLUCOSE 275*  BUN 8  CREATININE 0.83  CALCIUM 7.9*   ------------------------------------------------------------------------------------------------------------------  Cardiac Enzymes Recent Labs  Lab  09/27/18 0640  TROPONINI 0.04*   ------------------------------------------------------------------------------------------------------------------  RADIOLOGY:  Dg Chest 2 View  Result Date: 09/27/2018 CLINICAL DATA:  Increasing shortness of breath, wheezing, and fever. EXAM: CHEST - 2 VIEW COMPARISON:  CT chest 09/30/2016. Chest 03/31/2016. FINDINGS: Cardiac enlargement with perihilar interstitial infiltration likely representing edema. No focal consolidation. Small bilateral pleural effusions. Probable pleural calcification on the right. No pneumothorax. Mediastinal contours appear intact. IMPRESSION: Cardiac enlargement with perihilar interstitial edema and small bilateral pleural effusions. Electronically Signed   By: Lucienne Capers M.D.   On: 09/27/2018 01:05    EKG:   Orders placed or performed in visit on 02/27/04  . EKG 12-Lead    ASSESSMENT AND PLAN:   Severe Hypoxic and Hypercapnic Respiratory Failure from acute systolic CHF exacerbation from severe blood loss anemia -continue biPAP -continue Bronchodilator Therapy as needed -Wean Fio2 tolerated  CARDIAC FAILURE-EF 30% -oxygen as needed -Lasix as tolerated -follow up cardiac enzymes as indicated  ELECTROLYTES -follow labs as needed -replace as needed -pharmacy consultation and following   DVT/GI PRX ordered TRANSFUSIONS AS NEEDED for LOW HGB MONITOR FSBS ASSESS the need for LABS as needed  ACUTE ANEMIA-etiology unclear TRANSFUSE AS NEEDED DVT PRX with TED/SCD's ONLY   Critical Care Time devoted to patient care services described in this note is 35 minutes.   Overall, patient is critically ill, prognosis is guarded. high risk for cardiac arrest and death.    Corrin Parker, M.D.  [facility]  Medical Director [place] Director [facility] Cardio-Pulmonary Department

## 2018-09-27 NOTE — Telephone Encounter (Signed)
Patient's daughter Dawn Carpenter returned Dr. Sharmaine Base phone call, and apologized that she couldn't take his call earlier. She said best time to contact her is between 4:00-5:00, and she can be reached at [phone].

## 2018-09-27 NOTE — ED Notes (Signed)
ED TO INPATIENT HANDOFF REPORT  ED Nurse Name and Phone #: Valetta Fuller [phone]  S Name/Age/Gender Dawn Carpenter 73 y.o. female Room/Bed: ED15A/ED15A  Code Status   Code Status: Not on file  Home/SNF/Other Home Patient oriented to: self, place, time and situation Is this baseline? Yes   Triage Complete: Triage complete  Chief Complaint SOB  Triage Note No notes on file   Allergies Allergies  Allergen Reactions  . Wellbutrin [Bupropion]     Generic form.    Level of Care/Admitting Diagnosis ED Disposition    ED Disposition Condition [facility] Area: [place] [[id number]]  Level of Care: Med-Surg [16]  Diagnosis: Acute systolic heart failure ([place]) [428.21.ICD-9-CM]  Admitting Physician: Harrie Foreman [[id number]]  Attending Physician: Harrie Foreman [[id number]]  Estimated length of stay: past midnight tomorrow  Certification:: I certify this patient will need inpatient services for at least 2 midnights  PT Class (Do Not Modify): Inpatient [101]  PT Acc Code (Do Not Modify): Private [1]       B Medical/Surgery History Past Medical History:  Diagnosis Date  . Anemia   . Anxiety   . Arthritis    rheumatoid  . Depression   . GERD (gastroesophageal reflux disease)   . Hemorrhoids   . Hypertension   . Osteoporosis   . Thyroid disease    hypothyoridism   Past Surgical History:  Procedure Laterality Date  . ABDOMINAL HYSTERECTOMY    . COLONOSCOPY    . COLONOSCOPY WITH PROPOFOL N/A 05/15/2015   Procedure: COLONOSCOPY WITH PROPOFOL;  Surgeon: Manya Silvas, MD;  Location: [facility] ENDOSCOPY;  Service: Endoscopy;  Laterality: N/A;  . ESOPHAGOGASTRODUODENOSCOPY (EGD) WITH PROPOFOL N/A 05/15/2015   Procedure: ESOPHAGOGASTRODUODENOSCOPY (EGD) WITH PROPOFOL;  Surgeon: Manya Silvas, MD;  Location: [facility] ENDOSCOPY;  Service: Endoscopy;  Laterality: N/A;  . HAND SURGERY     for arthritis  . UPPER GI ENDOSCOPY       A IV  Location/Drains/Wounds Patient Lines/Drains/Airways Status   Active Line/Drains/Airways    Name:   Placement date:   Placement time:   Site:   Days:   Peripheral IV 09/26/18 Left Antecubital   09/26/18    2350    Antecubital   1          Intake/Output Last 24 hours  Intake/Output Summary (Last 24 hours) at 09/27/2018 0441 Last data filed at 09/27/2018 0403 Gross per 24 hour  Intake 450 ml  Output -  Net 450 ml    Labs/Imaging Results for orders placed or performed during the hospital encounter of 09/27/18 (from the past 48 hour(s))  Basic metabolic panel     Status: Abnormal   Collection Time: 09/27/18 12:12 AM  Result Value Ref Range   Sodium 135 135 - 145 mmol/L   Potassium 3.8 3.5 - 5.1 mmol/L   Chloride 106 98 - 111 mmol/L   CO2 14 (L) 22 - 32 mmol/L   Glucose, Bld 275 (H) 70 - 99 mg/dL   BUN 8 8 - 23 mg/dL   Creatinine, Ser 0.83 0.44 - 1.00 mg/dL   Calcium 7.9 (L) 8.9 - 10.3 mg/dL   GFR calc non Af Amer >60 >60 mL/min   GFR calc Af Amer >60 >60 mL/min   Anion gap 15 5 - 15    Comment: Performed at [facility], [address] [place], [place] [address]  Influenza panel by PCR (type A & B)  Status: None   Collection Time: 09/27/18 12:12 AM  Result Value Ref Range   Influenza A By PCR NEGATIVE NEGATIVE   Influenza B By PCR NEGATIVE NEGATIVE    Comment: (NOTE) The Xpert Xpress Flu assay is intended as an aid in the diagnosis of  influenza and should not be used as a sole basis for treatment.  This  assay is FDA approved for nasopharyngeal swab specimens only. Nasal  washings and aspirates are unacceptable for Xpert Xpress Flu testing. Performed at [facility], [address] [place], [place] [address]   Troponin I - Add-On to previous collection     Status: Abnormal   Collection Time: 09/27/18 12:12 AM  Result Value Ref Range   Troponin I 0.04 (HH) <0.03 ng/mL    Comment: CRITICAL RESULT CALLED TO, READ BACK BY AND VERIFIED WITH Catia  Sacha Radloff @0215  09/27/18 AKT Performed at [facility], [address] [place], [place] [address]   CBC with Differential/Platelet     Status: Abnormal   Collection Time: 09/27/18  1:32 AM  Result Value Ref Range   WBC 7.1 4.0 - 10.5 K/uL   RBC 1.81 (L) 3.87 - 5.11 MIL/uL   Hemoglobin 2.6 (LL) 12.0 - 15.0 g/dL    Comment: Reticulocyte Hemoglobin testing may be clinically indicated, consider ordering this additional test [id number] THIS CRITICAL RESULT HAS VERIFIED AND BEEN CALLED TO KATIE Jamekia Gannett BY [employer] TRENT ON 03 10 2020 AT 0227, AND HAS BEEN READ BACK. CALLED @0158  31020 AKT THIS CRITICAL RESULT HAS VERIFIED AND BEEN CALLED TO KATIE Letita Prentiss BY AMBER TRENT ON 03 10 2020 AT 0228, AND HAS BEEN READ BACK. CALLED @0158  31020 AKT    HCT 10.4 (LL) 36.0 - 46.0 %    Comment: This critical result has verified and been called to [place] by [employer] on 03 10 2020 at 0227, and has been read back. CALLED @0158  31020 AKT This critical result has verified and been called to [place] by [employer] on 03 10 2020 at 0228, and has been read back. CALLED @0158  31020 AKT    MCV 57.5 (L) 80.0 - 100.0 fL   MCH 14.4 (L) 26.0 - 34.0 pg   MCHC 25.0 (L) 30.0 - 36.0 g/dL   RDW 24.5 (H) 11.5 - 15.5 %   Platelets 311 150 - 400 K/uL   nRBC 0.0 0.0 - 0.2 %   Neutrophils Relative % 85 %   Neutro Abs 6.0 1.7 - 7.7 K/uL   Lymphocytes Relative 6 %   Lymphs Abs 0.4 (L) 0.7 - 4.0 K/uL   Monocytes Relative 8 %   Monocytes Absolute 0.6 0.1 - 1.0 K/uL   Eosinophils Relative 0 %   Eosinophils Absolute 0.0 0.0 - 0.5 K/uL   Basophils Relative 0 %   Basophils Absolute 0.0 0.0 - 0.1 K/uL   Immature Granulocytes 1 %   Abs Immature Granulocytes 0.04 0.00 - 0.07 K/uL   Target Cells PRESENT    Spherocytes PRESENT     Comment: Performed at [facility], [address], [place], [place] [address]  ABO/Rh     Status: None   Collection Time: 09/27/18  1:32 AM  Result Value Ref Range   ABO/RH(D)       A NEG Performed at [facility], [address] [place], [place] [address]   Prepare RBC     Status: None   Collection Time: 09/27/18  2:19 AM  Result Value  Ref Range   Order Confirmation      ORDER PROCESSED BY BLOOD BANK Performed at [facility], [address] [place], [place] [address]   Type and screen [place]     Status: None (Preliminary result)   Collection Time: 09/27/18  2:20 AM  Result Value Ref Range   ABO/RH(D) A NEG    Antibody Screen NEG    Sample Expiration 09/30/2018    Unit Number [id number]    Blood Component Type RED CELLS,LR    Unit division 00    Status of Unit ISSUED    Transfusion Status OK TO TRANSFUSE    Crossmatch Result Compatible    Unit Number [id number]    Blood Component Type RBC LR PHER1    Unit division 00    Status of Unit ISSUED    Transfusion Status OK TO TRANSFUSE    Crossmatch Result      Compatible Performed at [facility], [address], [place], [place] [address]    Dg Chest 2 View  Result Date: 09/27/2018 CLINICAL DATA:  Increasing shortness of breath, wheezing, and fever. EXAM: CHEST - 2 VIEW COMPARISON:  CT chest 09/30/2016. Chest 03/31/2016. FINDINGS: Cardiac enlargement with perihilar interstitial infiltration likely representing edema. No focal consolidation. Small bilateral pleural effusions. Probable pleural calcification on the right. No pneumothorax. Mediastinal contours appear intact. IMPRESSION: Cardiac enlargement with perihilar interstitial edema and small bilateral pleural effusions. Electronically Signed   By: Lucienne Capers M.D.   On: 09/27/2018 01:05    Pending Labs Unresulted Labs (From admission, onward)    Start     Ordered   09/27/18 0100  CBC with Differential/Platelet  Once,   STAT     09/27/18 0100   09/27/18 0008  CBC with Differential  Once,   STAT     09/27/18 0007   Signed and Held  TSH  Add-on,   R     Signed and Held   Signed and  Held  Iron and TIBC  Add-on,   R     Signed and Held   Signed and Held  Ferritin  Add-on,   R     Signed and Held          Vitals/Pain Today's Vitals   09/27/18 0008 09/27/18 0137 09/27/18 0400 09/27/18 0432  Temp:  97.6 F (36.4 C) (!) 97.5 F (36.4 C) 97.6 F (36.4 C)  TempSrc:   Oral   Weight: 48.5 kg     Height: 5\' 4"  (1.626 m)     PainSc:        Isolation Precautions No active isolations  Medications Medications  0.9 %  sodium chloride infusion (has no administration in time range)  ipratropium-albuterol (DUONEB) 0.5-2.5 (3) MG/3ML nebulizer solution 6 mL (6 mLs Nebulization Given 09/27/18 0015)  methylPREDNISolone sodium succinate (SOLU-MEDROL) 125 mg/2 mL injection 80 mg (80 mg Intravenous Given 09/27/18 0015)  sodium chloride 0.9 % bolus 500 mL (0 mLs Intravenous Stopped 09/27/18 0139)  furosemide (LASIX) injection 40 mg (40 mg Intravenous Given 09/27/18 0219)    Mobility walks with person assist Low fall risk   Focused Assessments Pulmonary Assessment Handoff:  Lung sounds: L Breath Sounds: Expiratory wheezes R Breath Sounds: Expiratory wheezes   Pt on 6L O2 via face mask for her comfort, she is utilizing accessory muscles to breathe. However, oxygen saturation was >90% on 2L via .          R Recommendations: See  Admitting Provider Note  Report given to:   Additional Notes: HGB 2.6; 2 units of blood are currently hanging.

## 2018-09-27 NOTE — Telephone Encounter (Signed)
Spoke to patient's daughter regarding her mother's clinical status.  Daughter plans to visit her mother sometime today.

## 2018-09-27 NOTE — Progress Notes (Signed)
eLink Physician-Brief Progress Note Patient Name: Dawn Carpenter DOB: August 20, 1945 MRN: [id number]   Date of Service  09/27/2018  HPI/Events of Note  73 year old female admitted with acute hypoxic respiratory failure secondary to acute decompensated CHF and severe anemia (hemoglobin 2.6). VSS.   eICU Interventions  No new orders.      Intervention Category Evaluation Type: New Patient Evaluation  Lysle Dingwall 09/27/2018, 6:29 AM

## 2018-09-27 NOTE — Telephone Encounter (Signed)
Left message for daughter-Karis to discuss pts care.   GB

## 2018-09-27 NOTE — Progress Notes (Signed)
*  PRELIMINARY RESULTS* Echocardiogram 2D Echocardiogram has been performed.  Dawn Carpenter 09/27/2018, 9:46 AM

## 2018-09-28 ENCOUNTER — Inpatient Hospital Stay: Payer: PPO

## 2018-09-28 DIAGNOSIS — I420 Dilated cardiomyopathy: Secondary | ICD-10-CM

## 2018-09-28 LAB — CBC
HCT: 23 % — ABNORMAL LOW (ref 36.0–46.0)
Hemoglobin: 7.2 g/dL — ABNORMAL LOW (ref 12.0–15.0)
MCH: 21.4 pg — ABNORMAL LOW (ref 26.0–34.0)
MCHC: 31.3 g/dL (ref 30.0–36.0)
MCV: 68.2 fL — AB (ref 80.0–100.0)
Platelets: 301 10*3/uL (ref 150–400)
RBC: 3.37 MIL/uL — ABNORMAL LOW (ref 3.87–5.11)
RDW: 29 % — ABNORMAL HIGH (ref 11.5–15.5)
WBC: 12.9 10*3/uL — ABNORMAL HIGH (ref 4.0–10.5)
nRBC: 0.6 % — ABNORMAL HIGH (ref 0.0–0.2)

## 2018-09-28 LAB — URINALYSIS, COMPLETE (UACMP) WITH MICROSCOPIC
Bacteria, UA: NONE SEEN
Bilirubin Urine: NEGATIVE
Glucose, UA: NEGATIVE mg/dL
Ketones, ur: 5 mg/dL — AB
Leukocytes,Ua: NEGATIVE
Nitrite: NEGATIVE
Protein, ur: NEGATIVE mg/dL
Specific Gravity, Urine: 1.01 (ref 1.005–1.030)
WBC, UA: NONE SEEN WBC/hpf (ref 0–5)
pH: 6 (ref 5.0–8.0)

## 2018-09-28 LAB — BASIC METABOLIC PANEL
Anion gap: 12 (ref 5–15)
BUN: 13 mg/dL (ref 8–23)
CO2: 22 mmol/L (ref 22–32)
Calcium: 8.3 mg/dL — ABNORMAL LOW (ref 8.9–10.3)
Chloride: 103 mmol/L (ref 98–111)
Creatinine, Ser: 0.59 mg/dL (ref 0.44–1.00)
GFR calc Af Amer: >60 mL/min (ref >60)
GFR calc non Af Amer: >60 mL/min (ref >60)
GLUCOSE: 122 mg/dL — AB (ref 70–99)
Potassium: 2.9 mmol/L — ABNORMAL LOW (ref 3.5–5.1)
Sodium: 137 mmol/L (ref 135–145)

## 2018-09-28 LAB — VITAMIN B12: Vitamin B-12: 762 pg/mL (ref 180–914)

## 2018-09-28 LAB — MAGNESIUM: Magnesium: 1.9 mg/dL (ref 1.7–2.4)

## 2018-09-28 LAB — PREPARE RBC (CROSSMATCH)

## 2018-09-28 LAB — BRAIN NATRIURETIC PEPTIDE: B Natriuretic Peptide: 4051 pg/mL — ABNORMAL HIGH (ref 0.0–100.0)

## 2018-09-28 LAB — POTASSIUM: Potassium: 2.9 mmol/L — ABNORMAL LOW (ref 3.5–5.1)

## 2018-09-28 MED ORDER — POTASSIUM CHLORIDE CRYS ER 20 MEQ PO TBCR
40.0000 meq | EXTENDED_RELEASE_TABLET | Freq: Two times a day (BID) | ORAL | Status: DC
  Administered 2018-09-28 – 2018-09-29 (×2): 40 meq via ORAL
  Filled 2018-09-28 (×2): qty 2

## 2018-09-28 MED ORDER — SODIUM CHLORIDE 0.9% FLUSH: 10.0000 mL | INTRAVENOUS | Status: DC | PRN

## 2018-09-28 MED ORDER — SODIUM CHLORIDE 0.9% IV SOLUTION
250.0000 mL | Freq: Once | INTRAVENOUS | Status: AC
  Administered 2018-09-28: 10 mL via INTRAVENOUS

## 2018-09-28 MED ORDER — POTASSIUM CHLORIDE CRYS ER 20 MEQ PO TBCR
40.0000 meq | EXTENDED_RELEASE_TABLET | Freq: Once | ORAL | Status: AC
  Administered 2018-09-28: 40 meq via ORAL
  Filled 2018-09-28: qty 2

## 2018-09-28 MED ORDER — HEPARIN SOD (PORK) LOCK FLUSH 100 UNIT/ML IV SOLN
250.0000 [IU] | INTRAVENOUS | Status: DC | PRN
  Filled 2018-09-28: qty 5

## 2018-09-28 MED ORDER — IPRATROPIUM-ALBUTEROL 0.5-2.5 (3) MG/3ML IN SOLN
3.0000 mL | Freq: Four times a day (QID) | RESPIRATORY_TRACT | Status: DC
  Administered 2018-09-28 – 2018-09-30 (×9): 3 mL via RESPIRATORY_TRACT
  Filled 2018-09-28 (×8): qty 3

## 2018-09-28 MED ORDER — HEPARIN SOD (PORK) LOCK FLUSH 100 UNIT/ML IV SOLN
500.0000 [IU] | Freq: Every day | INTRAVENOUS | Status: DC | PRN
  Filled 2018-09-28: qty 5

## 2018-09-28 MED ORDER — FUROSEMIDE 10 MG/ML IJ SOLN
20.0000 mg | Freq: Once | INTRAMUSCULAR | Status: AC
  Administered 2018-09-28: 20 mg via INTRAVENOUS
  Filled 2018-09-28: qty 2

## 2018-09-28 MED ORDER — SPIRONOLACTONE 25 MG PO TABS
12.5000 mg | ORAL_TABLET | Freq: Every day | ORAL | Status: DC
  Administered 2018-09-28 – 2018-10-01 (×4): 12.5 mg via ORAL
  Filled 2018-09-28: qty 0.5
  Filled 2018-09-28: qty 1
  Filled 2018-09-28: qty 0.5
  Filled 2018-09-28: qty 1
  Filled 2018-09-28 (×2): qty 0.5
  Filled 2018-09-28 (×2): qty 1

## 2018-09-28 MED ORDER — SODIUM CHLORIDE 0.9% FLUSH: 3.0000 mL | INTRAVENOUS | Status: DC | PRN

## 2018-09-28 NOTE — Progress Notes (Signed)
I have reviewed the care provided by hematology, cardiology and internal medicine.  She received another unit of packed cells today.  We have transition from BiPAP to HF [place].  If she remains off BiPAP tonight, will we might consider transfer to [place] floor 3/11.  Merton Border, MD PCCM service Mobile [phone] Pager [phone] 09/28/2018 6:20 PM

## 2018-09-28 NOTE — Progress Notes (Addendum)
Progress Note  Patient Name: Dawn Carpenter. Date of Encounter: 09/28/2018  Primary Cardiologist: Ida Rogue, MD  Subjective   Remains on bipap.  Feels like breathing is improving.  Nsg reports 3 failed attempts to get off of bipap yesterday.  No chest pain.  Good UO.  Inpatient Medications    Scheduled Meds: . budesonide (PULMICORT) nebulizer solution  0.5 mg Nebulization BID  . carvedilol  6.25 mg Oral BID WC  . docusate sodium  100 mg Oral BID  . furosemide  20 mg Intravenous Q12H  . ipratropium-albuterol  3 mL Nebulization Q4H  . levothyroxine  50 mcg Oral QAC breakfast  . losartan  25 mg Oral Daily  . methylPREDNISolone (SOLU-MEDROL) injection  40 mg Intravenous Q12H  . pantoprazole (PROTONIX) IV  40 mg Intravenous Q12H   Continuous Infusions:  PRN Meds: acetaminophen **OR** acetaminophen, ipratropium, levalbuterol, ondansetron **OR** ondansetron (ZOFRAN) IV, traZODone   Vital Signs    Vitals:   09/28/18 0348 09/28/18 0400 09/28/18 0500 09/28/18 0600  BP:  119/85 116/74 119/69  Pulse:  87 83 81  Resp:  (!) 25 (!) 22 (!) 25  Temp:  98.2 F (36.8 C)    TempSrc:  Oral    SpO2: 98% 98% 95% 100%  Weight:   47 kg   Height:        Intake/Output Summary (Last 24 hours) at 09/28/2018 0744 Last data filed at 09/28/2018 0352 Gross per 24 hour  Intake 31.5 ml  Output 2025 ml  Net -1993.5 ml   Filed Weights   09/27/18 0008 09/27/18 0630 09/28/18 0500  Weight: 48.5 kg 49.4 kg 47 kg    Physical Exam   GEN: Frail, thin, cont to require bipap. HEENT: Grossly normal.  Neck: Supple, JVD to jaw, no carotid bruits, or masses. Cardiac: RRR, distant heart sounds, no obvious murmurs, rubs, or gallops. No clubbing, cyanosis, edema.  Radials/DP/PT 2+ and equal bilaterally.  Respiratory:  Respirations regular and unlabored, coarse breath sounds w/ scattered rhonchi, insp/exp wheezing, and bibasilar crackles. GI: Soft, flat, nontender, nondistended, BS + x 4. MS: no  deformity or atrophy. Skin: warm and dry, no rash. Neuro:  Strength and sensation are intact. Psych: AAOx3.  Normal affect.  Labs    Chemistry Recent Labs  Lab 09/27/18 0012 09/28/18 0459  NA 135 137  K 3.8 2.9*  CL 106 103  CO2 14* 22  GLUCOSE 275* 122*  BUN 8 13  CREATININE 0.83 0.59  CALCIUM 7.9* 8.3*  GFRNONAA >60 >60  GFRAA >60 >60  ANIONGAP 15 12     Hematology Recent Labs  Lab 09/27/18 0132 09/27/18 0640 09/27/18 2139 09/28/18 0459  WBC 7.1  --   --  12.9*  RBC 1.81*  --   --  3.37*  HGB 2.6* 6.3* 7.5* 7.2*  HCT 10.4* 21.1* 23.5* 23.0*  MCV 57.5*  --   --  68.2*  MCH 14.4*  --   --  21.4*  MCHC 25.0*  --   --  31.3  RDW 24.5*  --   --  29.0*  PLT 311  --   --  301    Cardiac Enzymes Recent Labs  Lab 09/27/18 0012 09/27/18 0640  TROPONINI 0.04* 0.04*     BNP Recent Labs  Lab 09/27/18 0613 09/28/18 0459  BNP 1,456.0* 4,051.0*      Radiology    Dg Chest 2 View  Result Date: 09/27/2018 CLINICAL DATA:  Increasing shortness of breath, wheezing,  and fever. EXAM: CHEST - 2 VIEW COMPARISON:  CT chest 09/30/2016. Chest 03/31/2016. FINDINGS: Cardiac enlargement with perihilar interstitial infiltration likely representing edema. No focal consolidation. Small bilateral pleural effusions. Probable pleural calcification on the right. No pneumothorax. Mediastinal contours appear intact. IMPRESSION: Cardiac enlargement with perihilar interstitial edema and small bilateral pleural effusions. Electronically Signed   By: Lucienne Capers M.D.   On: 09/27/2018 01:05   Dg Chest Port 1 View  Result Date: 09/28/2018 CLINICAL DATA:  Respiratory failure EXAM: PORTABLE CHEST 1 VIEW COMPARISON:  Yesterday FINDINGS: Cardiac enlargement with diffuse interstitial opacity and small pleural effusions by prior radiograph. No air bronchogram. No pneumothorax. IMPRESSION: CHF pattern without interval improvement. Electronically Signed   By: Monte Fantasia M.D.   On: 09/28/2018  06:53    Telemetry    Sinus rhythm/sinus tach - Personally Reviewed  Cardiac Studies   2D Echocardiogram 3.10.2020   1. The left ventricle has severely reduced systolic function, with an ejection fraction of 25-30%. The cavity size was normal. There is mildly increased left ventricular wall thickness. Left ventricular diastolic Doppler parameters are consistent with  pseudonormalization. Left ventrical global hypokinesis without regional wall motion abnormalities.  2. The right ventricle has normal systolic function. The cavity was normal. There is no increase in right ventricular wall thickness. Right ventricular systolic pressure is moderately elevated with an estimated pressure of 46.5 mmHg.  3. Left atrial size was mildly dilated.  4. Mild stenosis of the aortic valve. Degree of stenosis may be underestimated secondary to depressed EF.  5. Mitral valve regurgitation is moderate to severe  6. Tricuspid valve regurgitation is moderate.  7. Left pleural effusion noted  Patient Profile     73 y.o. female w/ a h/o HTN, tob abuse, prior GIB, anemia, RA, OA, and GERD, who was admitted 3/10 2/2 progressive dyspnea and found to have a hgb of 2.6, CHF on xray, and EF of 25-30%.  Assessment & Plan    1.  Normocytic/Iron deficiency anemia:  Pt w/ prior h/o anemia and GIB, admitted 3/10 w/ progressive dyspnea.  H/H on arrival - 2.6/10.4. Now s/p PRBC's x 3  7.2/23.0.  Ferritin 4.  Further w/u per IM/heme.  Pt denies any recent h/o melena/brbpr. Seen by heme w/ rec to maintain Hgb @ 9-10.  2.  Acute systolic CHF:  In setting of above.  No prior cardiac history. Initially presumed to be 2/2 high output CHF however, echo w/ EF of 25-30%, glob HK. RVSP 46.5.  Minus 1.9L overnight and 1.5L since admission.  Wt down 2.4kg.  Renal fxn stable.  Very frail w/o obvious edema however, JVD to jaw.  Coarse breath sounds.  Cont IV diuresis today, esp if she is to receive more PRBCs.  Cont  blocker and ARB.  If  renal fxn remains stable, can likely look to transition to entresto.  With hypokalemia, I will add low dose spiro.  Interestingly, despite profound anemia and CHF, trop only 0.04 x 2.  She is too frail for cath and thus may best be served by conservative mgmt.  3.  AECOPD/Tob abuse:  Steroids/nebs per IM/CCM.  Smoking cessation advised.  4.  Hypokalemia:  Supp.  Adding spiro. Add Mg to AM lab collection and f/u K later today to determine if more supp required.  5.  Demand ischemia:  Mild trop elevation in the setting of #1 and #2.  No chest pain. Cont  blocker.  No ASA currently in setting of profound anemia.  LDL 52 in absence of statin.  Signed, Murray Hodgkins, NP  09/28/2018, 7:44 AM    For questions or updates, please contact   Please consult [email or web address] for contact info under Cardiology/STEMI.

## 2018-09-28 NOTE — Progress Notes (Signed)
Dawn Carpenter   DOB:May 06, 1946   VO#:[id number]    Subjective: No acute events overnight.  Patient received total 3 units of PRBC transfusion so far.  No blood in stools or black or stools.  Patient currently is on high flow nasal oxygen.  Having good urine output on Lasix.  Objective:  Vitals:   09/28/18 1951 09/28/18 2000  BP:  (!) 109/59  Pulse:  77  Resp:  (!) 23  Temp:    SpO2: 97% 97%     Intake/Output Summary (Last 24 hours) at 09/28/2018 2108 Last data filed at 09/28/2018 2000 Gross per 24 hour  Intake 970 ml  Output 3325 ml  Net -2355 ml    Physical Exam  Constitutional: She is oriented to person, place, and time.  Patient appears cachectic.  Resting comfortably on oxygen high flow.  Alone.  HENT:  Head: Normocephalic and atraumatic.  Mouth/Throat: Oropharynx is clear and moist. No oropharyngeal exudate.  Eyes: Pupils are equal, round, and reactive to light.  Neck: Normal range of motion. Neck supple.  Cardiovascular: Normal rate and regular rhythm.  Pulmonary/Chest: No respiratory distress. She has no wheezes.  Bilateral coarse breath sounds/scattered wheeze.  Abdominal: Soft. Bowel sounds are normal. She exhibits no distension and no mass. There is no abdominal tenderness. There is no rebound and no guarding.  Musculoskeletal: Normal range of motion.        General: No tenderness or edema.  Neurological: She is alert and oriented to person, place, and time.  Skin: Skin is warm. There is pallor.  Psychiatric: Affect normal.     Labs:  Lab Results  Component Value Date   WBC 12.9 (H) 09/28/2018   HGB 7.2 (L) 09/28/2018   HCT 23.0 (L) 09/28/2018   MCV 68.2 (L) 09/28/2018   PLT 301 09/28/2018   NEUTROABS 6.0 09/27/2018    Lab Results  Component Value Date   NA 137 09/28/2018   K 2.9 (L) 09/28/2018   CL 103 09/28/2018   CO2 22 09/28/2018    Studies:  Dg Chest 2 View  Result Date: 09/27/2018 CLINICAL DATA:  Increasing shortness of breath, wheezing,  and fever. EXAM: CHEST - 2 VIEW COMPARISON:  CT chest 09/30/2016. Chest 03/31/2016. FINDINGS: Cardiac enlargement with perihilar interstitial infiltration likely representing edema. No focal consolidation. Small bilateral pleural effusions. Probable pleural calcification on the right. No pneumothorax. Mediastinal contours appear intact. IMPRESSION: Cardiac enlargement with perihilar interstitial edema and small bilateral pleural effusions. Electronically Signed   By: Lucienne Capers M.D.   On: 09/27/2018 01:05   Dg Chest Port 1 View  Result Date: 09/28/2018 CLINICAL DATA:  Respiratory failure EXAM: PORTABLE CHEST 1 VIEW COMPARISON:  Yesterday FINDINGS: Cardiac enlargement with diffuse interstitial opacity and small pleural effusions by prior radiograph. No air bronchogram. No pneumothorax. IMPRESSION: CHF pattern without interval improvement. Electronically Signed   By: Monte Fantasia M.D.   On: 09/28/2018 06:53    Symptomatic anemia 73 year old female patient with prior history of iron deficient anemia is currently admitted to hospital for severe anemia/acute congestive heart failure/respiratory failure  #Severe symptomatic iron deficiency anemia hemoglobin at presentation 2.6; status post 3 years of PRBC transfusion hemoglobin up to 7.  Given the acute congestive heart failure recommend 1 more unit of blood this morning.  Also recommend extra dose of IV Lasix 20 mg.  Discussed with nurse.  #Etiology of iron deficiency-i seems to be chronic given the severe microcytosis.?  Malabsorption versus chronic blood loss.  Eventually  will need GI work-up and clinically stable.  Would also recommend IV iron when clinically stable.  #Acute respiratory failure/acute congestive heart failure ejection fraction 25 to 30%.  Improving.  Defer to primary team/cardiology.     Cammie Sickle, MD 09/28/2018  9:08 PM

## 2018-09-28 NOTE — TOC Initial Note (Signed)
Transition of Care [facility]) - Initial/Assessment Note    Patient Details  Name: Dawn Carpenter MRN: [id number] Date of Birth: 09-26-1945  Transition of Care [facility]) CM/SW Contact:    Shelbie Hutching, RN Phone Number: 09/28/2018, 11:24 AM  Clinical Narrative:                 RNCM briefly spoke with patient and patient states that her daughter would like to speak with CM.  RNCM was told by bedside nurse that patient has been staying in a hotel because she lost her home.  Patient does have a daughter and the patient states that at discharge she will go and live with her daughter in [place].  When patient asked about current living situation she asked for her nurse and did not answer the question.  RNCM will come back at a later time when the daughter is here.   Expected Discharge Plan: Home/Self Care     Patient Goals and CMS Choice        Expected Discharge Plan and Services Expected Discharge Plan: Home/Self Care     Living arrangements for the past 2 months: Hotel/Motel                          Prior Living Arrangements/Services Living arrangements for the past 2 months: Hotel/Motel Lives with:: Self          Need for Family Participation in Patient Care: Yes (Comment) Care giver support system in place?: Yes (comment)      Activities of Daily Living Home Assistive Devices/Equipment: None ADL Screening (condition at time of admission) Patient's cognitive ability adequate to safely complete daily activities?: Yes Is the patient deaf or have difficulty hearing?: No Does the patient have difficulty seeing, even when wearing glasses/contacts?: No Does the patient have difficulty concentrating, remembering, or making decisions?: No Patient able to express need for assistance with ADLs?: Yes Does the patient have difficulty dressing or bathing?: No Independently performs ADLs?: Yes (appropriate for developmental age) Does the patient have difficulty walking or climbing  stairs?: No Weakness of Legs: None Weakness of Arms/Hands: None  Permission Sought/Granted                  Emotional Assessment Appearance:: Appears stated age Attitude/Demeanor/Rapport: Avoidant Affect (typically observed): Apprehensive, Calm Orientation: : Oriented to Self, Oriented to Place, Oriented to  Time, Oriented to Situation Alcohol / Substance Use: Not Applicable Psych Involvement: No (comment)  Admission diagnosis:  Acute respiratory failure with hypoxia (HCC) [J96.01] Symptomatic anemia [D64.9] Acute congestive heart failure, unspecified heart failure type ([place]) [N56.2] Acute systolic heart failure ([place]) [I50.21] Patient Active Problem List   Diagnosis Date Noted  . Acute systolic heart failure ([place]) 09/27/2018  . Symptomatic anemia 09/27/2018  . Screening for breast cancer 05/18/2018  . Need for vaccination against Streptococcus pneumoniae using pneumococcal conjugate vaccine 13 05/18/2018  . Dysuria 05/18/2018  . Essential hypertension 10/06/2017  . Acquired hypothyroidism 10/06/2017  . 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 Generalized anxiety disorder 10/06/2017  . Personal history of tobacco use, presenting hazards to health 01/23/2016  . Gastroesophageal reflux disease 03/08/2015  . Heme positive stool 03/08/2015  . Iron deficiency anemia, unspecified 03/08/2015   PCP:  Lavera Guise, MD Pharmacy:   [facility] [address] [place], [place] [address] [place] [address] Phone: [phone]  Fax: [phone]     Social Determinants of Health (SDOH) Interventions    Readmission Risk Interventions 30 Day Unplanned Readmission Risk Score     ED to Hosp-Admission (Current) from 09/27/2018 in [place] ICU/CCU  30 Day Unplanned Readmission Risk Score (%)  11 Filed at 09/28/2018 0801     This score is the patient's risk of an  unplanned readmission within 30 days of being discharged (0 -100%). The score is based on dignosis, age, lab data, medications, orders, and past utilization.   Low:  0-14.9   Medium: 15-21.9   High: 22-29.9   Extreme: 30 and above       No flowsheet data found.

## 2018-09-28 NOTE — Progress Notes (Signed)
[facility] [place] NAME: Dawn Carpenter    MR#:  [id number]  DATE OF BIRTH:  July 11, 1946  SUBJECTIVE:  CHIEF COMPLAINT:   Chief Complaint  Patient presents with  . Shortness of Breath   -Still feels very short of breath.  Low potassium.  Hemoglobin at 7.2 after 3 units transfusion -1 more unit ordered -On high flow nasal cannula  REVIEW OF SYSTEMS:  Review of Systems  Constitutional: Positive for diaphoresis and malaise/fatigue. Negative for chills and fever.  HENT: Negative for congestion, ear discharge, hearing loss and nosebleeds.   Eyes: Negative for blurred vision and double vision.  Respiratory: Positive for shortness of breath. Negative for cough and wheezing.   Cardiovascular: Positive for chest pain. Negative for palpitations and leg swelling.  Gastrointestinal: Negative for abdominal pain, constipation, diarrhea, nausea and vomiting.  Genitourinary: Negative for dysuria.  Musculoskeletal: Negative for myalgias.  Neurological: Negative for dizziness, focal weakness, seizures, weakness and headaches.  Psychiatric/Behavioral: Negative for depression.    DRUG ALLERGIES:   Allergies  Allergen Reactions  . Wellbutrin [Bupropion] Other (See Comments)    Generic form.    VITALS:  Blood pressure 137/83, pulse 81, temperature (!) 97.2 F (36.2 C), temperature source Axillary, resp. rate 20, height 5\' 4"  (1.626 m), weight 47 kg, SpO2 97 %.  PHYSICAL EXAMINATION:  Physical Exam   GENERAL:  73 y.o.-year-old patient lying in the bed, not in any acute distress.  EYES: Pupils equal, round, reactive to light and accommodation. No scleral icterus. Extraocular muscles intact.  HEENT: Head atraumatic, normocephalic. Oropharynx and nasopharynx clear.  NECK:  Supple, no jugular venous distention. No thyroid enlargement, no tenderness.  LUNGS: Normal breath sounds bilaterally, no wheezing, rhonchi or crepitation. No use of accessory  muscles of respiration.  Bibasilar crackles noted CARDIOVASCULAR: S1, S2 normal. No  rubs, or gallops.  2/6 systolic murmur is auscultated ABDOMEN: Soft, nontender, nondistended. Bowel sounds present. No organomegaly or mass.  EXTREMITIES: No pedal edema, cyanosis, or clubbing.  NEUROLOGIC: Cranial nerves II through XII are intact. Muscle strength 5/5 in all extremities. Sensation intact. Gait not checked.  Global weakness PSYCHIATRIC: The patient is alert and oriented x 3.  SKIN: No obvious rash, lesion, or ulcer.    LABORATORY PANEL:   CBC Recent Labs  Lab 09/28/18 0459  WBC 12.9*  HGB 7.2*  HCT 23.0*  PLT 301   ------------------------------------------------------------------------------------------------------------------  Chemistries  Recent Labs  Lab 09/28/18 0459  NA 137  K 2.9*  CL 103  CO2 22  GLUCOSE 122*  BUN 13  CREATININE 0.59  CALCIUM 8.3*  MG 1.9   ------------------------------------------------------------------------------------------------------------------  Cardiac Enzymes Recent Labs  Lab 09/27/18 0640  TROPONINI 0.04*   ------------------------------------------------------------------------------------------------------------------  RADIOLOGY:  Dg Chest 2 View  Result Date: 09/27/2018 CLINICAL DATA:  Increasing shortness of breath, wheezing, and fever. EXAM: CHEST - 2 VIEW COMPARISON:  CT chest 09/30/2016. Chest 03/31/2016. FINDINGS: Cardiac enlargement with perihilar interstitial infiltration likely representing edema. No focal consolidation. Small bilateral pleural effusions. Probable pleural calcification on the right. No pneumothorax. Mediastinal contours appear intact. IMPRESSION: Cardiac enlargement with perihilar interstitial edema and small bilateral pleural effusions. Electronically Signed   By: Lucienne Capers M.D.   On: 09/27/2018 01:05   Dg Chest Port 1 View  Result Date: 09/28/2018 CLINICAL DATA:  Respiratory failure EXAM:  PORTABLE CHEST 1 VIEW COMPARISON:  Yesterday FINDINGS: Cardiac enlargement with diffuse interstitial opacity and small pleural effusions by prior radiograph. No  air bronchogram. No pneumothorax. IMPRESSION: CHF pattern without interval improvement. Electronically Signed   By: Monte Fantasia M.D.   On: 09/28/2018 06:53    EKG:   Orders placed or performed in visit on 02/27/04  . EKG 12-Lead    ASSESSMENT AND PLAN:   73 year old female with past medical history significant for GERD, hypertension, osteoporosis and arthritis, anemia of chronic disease presents to hospital secondary to worsening shortness of breath over few weeks now.  1.  Acute on chronic anemia-denies any active bleeding.  Hemoglobin was noted to be at 2.6 on admission. -hemoglobin was 11 in 2017 -Anemia panel ordered, significantly low ferritin levels.  With normal iron levels not sure if it was drawn after blood transfusion. -Patient received 3 units packed RBC transfusion and hemoglobin is 7.2.   -Given acute cardiac history, hemoglobin around 9 is desired.  So 1 more unit has been ordered for transfusion- -appreciate hematology consult  - GI consult as outpatient once respiratory condition is stable  2.  Acute respiratory failure secondary to hypoxia-secondary to acute CHF exacerbation. -  not on home oxygen.  Elevated BNP and also chest x-ray with significant pulmonary edema -On BiPAP on admission, now changed to high flow nasal cannula at 30% FiO2 -Could have been triggered by anemia -Echocardiogram with global weakness, EF of 20 to 25%.  Diffuse hypokinesis.  Cardiac cath prior to discharge or as outpatient? -Continue IV Lasix -Appreciate cardiology consult -Also has underlying COPD. -CHF medications like Coreg, losartan, Aldactone and Lasix have been ordered  3.  Hypertension-on IV Lasix.  Also on losartan  4.Hypothyroidism- synthroid  5. DVT prophylaxis- TEDs and SCDs only  6.  Hypokalemia-secondary to  Lasix.  Replace  Independent at baseline PT consult once stable Daughter updated at bedside.  Case manager consult as patient does not have a place to go as her house is being foreclosed     All the records are reviewed and case discussed with Care Management/Social Workerr. Management plans discussed with the patient, family and they are in agreement.  CODE STATUS: Full code  TOTAL TIME TAKING CARE OF THIS PATIENT: 36 minutes.   POSSIBLE D/C IN 2-3 DAYS, DEPENDING ON CLINICAL CONDITION.   Gladstone Lighter M.D on 09/28/2018 at 2:04 PM  Between 7am to 6pm - Pager - [phone]  After 6pm go to [email or web address] - password [email or web address] [facility]  Office  [phone]  CC: Primary care physician; Lavera Guise, MD

## 2018-09-29 ENCOUNTER — Inpatient Hospital Stay: Payer: PPO

## 2018-09-29 DIAGNOSIS — J441 Chronic obstructive pulmonary disease with (acute) exacerbation: Secondary | ICD-10-CM

## 2018-09-29 DIAGNOSIS — D649 Anemia, unspecified: Secondary | ICD-10-CM

## 2018-09-29 LAB — BPAM RBC
Blood Product Expiration Date: 202003162359
Blood Product Expiration Date: 202003162359
Blood Product Expiration Date: 202003172359
Blood Product Expiration Date: 202003252359
ISSUE DATE / TIME: 202003100342
ISSUE DATE / TIME: 202003100419
ISSUE DATE / TIME: 202003101658
ISSUE DATE / TIME: 202003111305
Unit Type and Rh: 600
Unit Type and Rh: 600
Unit Type and Rh: 600
Unit Type and Rh: 600

## 2018-09-29 LAB — BASIC METABOLIC PANEL
ANION GAP: 10 (ref 5–15)
BUN: 15 mg/dL (ref 8–23)
CO2: 27 mmol/L (ref 22–32)
Calcium: 8.5 mg/dL — ABNORMAL LOW (ref 8.9–10.3)
Chloride: 102 mmol/L (ref 98–111)
Creatinine, Ser: 0.55 mg/dL (ref 0.44–1.00)
GFR calc Af Amer: >60 mL/min (ref >60)
GFR calc non Af Amer: >60 mL/min (ref >60)
Glucose, Bld: 91 mg/dL (ref 70–99)
Potassium: 3.2 mmol/L — ABNORMAL LOW (ref 3.5–5.1)
Sodium: 139 mmol/L (ref 135–145)

## 2018-09-29 LAB — TYPE AND SCREEN
ABO/RH(D): A NEG
Antibody Screen: NEGATIVE
UNIT DIVISION: 0
Unit division: 0
Unit division: 0
Unit division: 0

## 2018-09-29 LAB — CBC
HCT: 29.8 % — ABNORMAL LOW (ref 36.0–46.0)
Hemoglobin: 9.6 g/dL — ABNORMAL LOW (ref 12.0–15.0)
MCH: 22.2 pg — ABNORMAL LOW (ref 26.0–34.0)
MCHC: 32.2 g/dL (ref 30.0–36.0)
MCV: 68.8 fL — ABNORMAL LOW (ref 80.0–100.0)
Platelets: 301 10*3/uL (ref 150–400)
RBC: 4.33 MIL/uL (ref 3.87–5.11)
RDW: 27.9 % — ABNORMAL HIGH (ref 11.5–15.5)
WBC: 9.9 10*3/uL (ref 4.0–10.5)
nRBC: 0.4 % — ABNORMAL HIGH (ref 0.0–0.2)

## 2018-09-29 LAB — BRAIN NATRIURETIC PEPTIDE: B Natriuretic Peptide: 3089 pg/mL — ABNORMAL HIGH (ref 0.0–100.0)

## 2018-09-29 MED ORDER — LOSARTAN POTASSIUM 25 MG PO TABS
12.5000 mg | ORAL_TABLET | Freq: Every day | ORAL | Status: DC
  Administered 2018-09-30 – 2018-10-01 (×2): 12.5 mg via ORAL
  Filled 2018-09-29 (×2): qty 1

## 2018-09-29 MED ORDER — CARVEDILOL 3.125 MG PO TABS
3.1250 mg | ORAL_TABLET | Freq: Two times a day (BID) | ORAL | Status: DC
  Administered 2018-09-30 – 2018-10-01 (×3): 3.125 mg via ORAL
  Filled 2018-09-29 (×3): qty 1

## 2018-09-29 MED ORDER — POTASSIUM CHLORIDE CRYS ER 20 MEQ PO TBCR
40.0000 meq | EXTENDED_RELEASE_TABLET | Freq: Three times a day (TID) | ORAL | Status: DC
  Administered 2018-09-29 (×2): 40 meq via ORAL
  Filled 2018-09-29 (×2): qty 2

## 2018-09-29 MED ORDER — BUDESONIDE 0.25 MG/2ML IN SUSP
0.2500 mg | Freq: Two times a day (BID) | RESPIRATORY_TRACT | Status: DC
  Administered 2018-09-29 – 2018-10-01 (×4): 0.25 mg via RESPIRATORY_TRACT
  Filled 2018-09-29 (×4): qty 2

## 2018-09-29 NOTE — Progress Notes (Signed)
Progress Note  Patient Name: Dawn Carpenter. Date of Encounter: 09/29/2018  Primary Cardiologist: Ida Rogue, MD  Subjective   Breathing improved.  Currently on high flow nasal cannula.  No chest pain.  Tolerated transfusion yesterday with hemoglobin up to 9.6 today.  Inpatient Medications    Scheduled Meds: . budesonide (PULMICORT) nebulizer solution  0.5 mg Nebulization BID  . carvedilol  6.25 mg Oral BID WC  . docusate sodium  100 mg Oral BID  . furosemide  20 mg Intravenous Q12H  . ipratropium-albuterol  3 mL Nebulization Q6H  . levothyroxine  50 mcg Oral QAC breakfast  . losartan  25 mg Oral Daily  . pantoprazole (PROTONIX) IV  40 mg Intravenous Q12H  . potassium chloride  40 mEq Oral BID  . spironolactone  12.5 mg Oral Daily   Continuous Infusions:  PRN Meds: acetaminophen **OR** acetaminophen, heparin lock flush, heparin lock flush, [DISCONTINUED] ondansetron **OR** ondansetron (ZOFRAN) IV, sodium chloride flush, sodium chloride flush, traZODone   Vital Signs    Vitals:   09/29/18 0300 09/29/18 0400 09/29/18 0500 09/29/18 0600  BP: 101/66 (!) 83/49 102/70 (!) 102/55  Pulse: 70 63 78 86  Resp: 16 14 (!) 26 13  Temp:   98.2 F (36.8 C)   TempSrc:   Oral   SpO2: 97% 97% 98% 98%  Weight:      Height:        Intake/Output Summary (Last 24 hours) at 09/29/2018 1028 Last data filed at 09/29/2018 0600 Gross per 24 hour  Intake 630 ml  Output 2500 ml  Net -1870 ml   Filed Weights   09/27/18 0008 09/27/18 0630 09/28/18 0500  Weight: 48.5 kg 49.4 kg 47 kg    Physical Exam   GEN: Frail, thin, tolerating high flow nasal cannula and in no acute distress.  HEENT: Grossly normal.  Neck: Supple, no JVD, carotid bruits, or masses. Cardiac: RRR, distant heart sounds with soft systolic murmur at the right upper sternal border, no rubs, or gallops. No clubbing, cyanosis, edema.  Radials/DP/PT 2+ and equal bilaterally.  Respiratory:  Respirations regular and  unlabored, scattered rhonchi with bibasilar crackles, though overall improved compared to yesterday. GI: Soft, nontender, nondistended, BS + x 4. MS: no deformity or atrophy. Skin: warm and dry, no rash. Neuro:  Strength and sensation are intact. Psych: AAOx3.  Normal affect.  Labs    Chemistry Recent Labs  Lab 09/27/18 0012 09/28/18 0459 09/28/18 2043 09/29/18 0456  NA 135 137  --  139  K 3.8 2.9* 2.9* 3.2*  CL 106 103  --  102  CO2 14* 22  --  27  GLUCOSE 275* 122*  --  91  BUN 8 13  --  15  CREATININE 0.83 0.59  --  0.55  CALCIUM 7.9* 8.3*  --  8.5*  GFRNONAA >60 >60  --  >60  GFRAA >60 >60  --  >60  ANIONGAP 15 12  --  10     Hematology Recent Labs  Lab 09/27/18 0132  09/27/18 2139 09/28/18 0459 09/29/18 0456  WBC 7.1  --   --  12.9* 9.9  RBC 1.81*  --   --  3.37* 4.33  HGB 2.6*   < > 7.5* 7.2* 9.6*  HCT 10.4*   < > 23.5* 23.0* 29.8*  MCV 57.5*  --   --  68.2* 68.8*  MCH 14.4*  --   --  21.4* 22.2*  MCHC 25.0*  --   --  31.3 32.2  RDW 24.5*  --   --  29.0* 27.9*  PLT 311  --   --  301 301   < > = values in this interval not displayed.    Cardiac Enzymes Recent Labs  Lab 09/27/18 0012 09/27/18 0640  TROPONINI 0.04* 0.04*     BNP Recent Labs  Lab 09/27/18 0613 09/28/18 0459 09/29/18 0456  BNP 1,456.0* 4,051.0* 3,089.0*      Radiology    Dg Chest Port 1 View  Result Date: 09/29/2018 CLINICAL DATA:  Acute respiratory failure EXAM: PORTABLE CHEST 1 VIEW COMPARISON:  09/27/2018 FINDINGS: Cardiac enlargement. Bilateral airspace disease shows mild interval improvement. Progression of small right pleural effusion. Bibasilar atelectasis. IMPRESSION: Mild improvement in bilateral airspace disease most consistent with congestive heart failure. Progression of small right pleural effusion. Electronically Signed   By: Franchot Gallo M.D.   On: 09/29/2018 08:20   Dg Chest Port 1 View  Result Date: 09/28/2018 CLINICAL DATA:  Respiratory failure EXAM:  PORTABLE CHEST 1 VIEW COMPARISON:  Yesterday FINDINGS: Cardiac enlargement with diffuse interstitial opacity and small pleural effusions by prior radiograph. No air bronchogram. No pneumothorax. IMPRESSION: CHF pattern without interval improvement. Electronically Signed   By: Monte Fantasia M.D.   On: 09/28/2018 06:53    Telemetry    Regular sinus rhythm, 60s-80s- Personally Reviewed  Cardiac Studies   2D Echocardiogram 3.10.2020  1. The left ventricle has severely reduced systolic function, with an ejection fraction of 25-30%. The cavity size was normal. There is mildly increased left ventricular wall thickness. Left ventricular diastolic Doppler parameters are consistent with  pseudonormalization. Left ventrical global hypokinesis without regional wall motion abnormalities. 2. The right ventricle has normal systolic function. The cavity was normal. There is no increase in right ventricular wall thickness. Right ventricular systolic pressure is moderately elevated with an estimated pressure of 46.5 mmHg. 3. Left atrial size was mildly dilated. 4. Mild stenosis of the aortic valve. Degree of stenosis may be underestimated secondary to depressed EF. 5. Mitral valve regurgitation is moderate to severe 6. Tricuspid valve regurgitation is moderate. 7. Left pleural effusion noted  Patient Profile     73 y.o. female w/ a h/o HTN, tob abuse, prior GIB, anemia, RA, OA, and GERD, who was admitted 3/10 2/2 progressive dyspnea and found to have a hgb of 2.6, CHF on xray, and EF of 25-30%.  Assessment & Plan    1.  Normocytic/iron deficiency anemia: Prior history of anemia and GI bleed, admitted March 10 with progressive dyspnea and finding of H&H of 2.6 and 10.4.  Status post 4 units of packed red blood cells with H&H of 9.6 and 29.8 this morning.  Ferritin 4 on admission with plan for IV iron.  Hematology following.  2.  Acute systolic congestive heart failure: In the setting of above.  No  prior cardiac history.  Echo showed an EF of 25 to 30% with global hypokinesis and RVSP of 46.5.  She has responded well to diuresis and appears to be euvolemic on examination this morning.  -1.8 L overnight and -2.9 L since admission.  Weight not yet recorded this morning.  Renal function has remained stable.  BNP remains elevated at 3089.  Continued diuretic therapy today.  She remains on beta-blocker, ARB, and Spironolactone therapy.  Pressure is too soft to escalate or to consider transitioning to [facility].  As previously noted, she is quite frail and we would not pursue ischemic evaluation/catheterization at this time.  Continues conservative therapy.  3.  AE COPD/tobacco abuse: Steroids/nebulizers per internal medicine/critical care.  Smoking cessation advised.  4.  Hypokalemia: Potassium added yesterday along with spironolactone.  Potassium only 3.2 this morning.  Magnesium was 1.9 yesterday.  We will escalate potassium supplementation.  5.  Demand ischemia: Mild troponin elevation in the setting of #1 and #2.  No chest pain.  Continue beta-blocker.  No aspirin in the setting of anemia though good look to add once stabilized.  LDL 52 in the absence of a statin.  Signed, Murray Hodgkins, NP  09/29/2018, 10:28 AM    For questions or updates, please contact   Please consult [email or web address] for contact info under Cardiology/STEMI.

## 2018-09-29 NOTE — TOC Progression Note (Signed)
Transition of Care [facility]) - Progression Note    Patient Details  Name: Marizol Borror MRN: [id number] Date of Birth: 1945-09-19  [facility]) CM/SW Contact  Shelbie Hutching, RN Phone Number: 09/29/2018, 4:17 PM  Clinical Narrative:    RNCM attempted to contact daughter, no answer but message left just for a return call.  Patient is lying in bed with eyes closed.  Patient remains on HFNC.     Expected Discharge Plan: Home/Self Care    Expected Discharge Plan and Services Expected Discharge Plan: Home/Self Care     Living arrangements for the past 2 months: Hotel/Motel                           Social Determinants of Health (SDOH) Interventions    Readmission Risk Interventions 30 Day Unplanned Readmission Risk Score     ED to Hosp-Admission (Current) from 09/27/2018 in [place] ICU/CCU  30 Day Unplanned Readmission Risk Score (%)  12 Filed at 09/29/2018 1600     This score is the patient's risk of an unplanned readmission within 30 days of being discharged (0 -100%). The score is based on dignosis, age, lab data, medications, orders, and past utilization.   Low:  0-14.9   Medium: 15-21.9   High: 22-29.9   Extreme: 30 and above       No flowsheet data found.

## 2018-09-29 NOTE — Progress Notes (Signed)
Patient is comfortable on low-flow [place] O2 off BiPAP.  I have placed order for transfer to [place] floor with cardiac monitoring.  After transfer, PCCM will sign off. Please call if we can be of further assistance  Merton Border, MD PCCM service Mobile [phone] Pager [phone] 09/29/2018 5:24 PM

## 2018-09-29 NOTE — Progress Notes (Signed)
[place] [facility] [place] NAME: Dawn Carpenter    MR#:  [id number]  DATE OF BIRTH:  01-29-1946  SUBJECTIVE:  CHIEF COMPLAINT:   Chief Complaint  Patient presents with  . Shortness of Breath   -Frail -appearing.  On high flow nasal cannula.  Off BiPAP -Feels some better today  REVIEW OF SYSTEMS:  Review of Systems  Constitutional: Positive for diaphoresis and malaise/fatigue. Negative for chills and fever.  HENT: Negative for congestion, ear discharge, hearing loss and nosebleeds.   Eyes: Negative for blurred vision and double vision.  Respiratory: Positive for shortness of breath. Negative for cough and wheezing.   Cardiovascular: Positive for chest pain. Negative for palpitations and leg swelling.  Gastrointestinal: Negative for abdominal pain, constipation, diarrhea, nausea and vomiting.  Genitourinary: Negative for dysuria.  Musculoskeletal: Negative for myalgias.  Neurological: Negative for dizziness, focal weakness, seizures, weakness and headaches.  Psychiatric/Behavioral: Negative for depression.    DRUG ALLERGIES:   Allergies  Allergen Reactions  . Wellbutrin [Bupropion] Other (See Comments)    Generic form.    VITALS:  Blood pressure (!) 102/55, pulse 86, temperature 98.2 F (36.8 C), temperature source Oral, resp. rate 13, height 5\' 4"  (1.626 m), weight 47 kg, SpO2 98 %.  PHYSICAL EXAMINATION:  Physical Exam   GENERAL:  73 y.o.-year-old patient lying in the bed, not in any acute distress.  EYES: Pupils equal, round, reactive to light and accommodation. No scleral icterus. Extraocular muscles intact.  HEENT: Head atraumatic, normocephalic. Oropharynx and nasopharynx clear.  NECK:  Supple, no jugular venous distention. No thyroid enlargement, no tenderness.  LUNGS: Normal breath sounds bilaterally, no wheezing, rhonchi or crepitation. No use of accessory muscles of respiration.  Bibasilar crackles noted CARDIOVASCULAR:  S1, S2 normal. No  rubs, or gallops.  2/6 systolic murmur is auscultated ABDOMEN: Soft, nontender, nondistended. Bowel sounds present. No organomegaly or mass.  EXTREMITIES: No pedal edema, cyanosis, or clubbing.  NEUROLOGIC: Cranial nerves II through XII are intact. Muscle strength 5/5 in all extremities. Sensation intact. Gait not checked.  Global weakness PSYCHIATRIC: The patient is alert and oriented x 3.  SKIN: No obvious rash, lesion, or ulcer.    LABORATORY PANEL:   CBC Recent Labs  Lab 09/29/18 0456  WBC 9.9  HGB 9.6*  HCT 29.8*  PLT 301   ------------------------------------------------------------------------------------------------------------------  Chemistries  Recent Labs  Lab 09/28/18 0459  09/29/18 0456  NA 137  --  139  K 2.9*   < > 3.2*  CL 103  --  102  CO2 22  --  27  GLUCOSE 122*  --  91  BUN 13  --  15  CREATININE 0.59  --  0.55  CALCIUM 8.3*  --  8.5*  MG 1.9  --   --    < > = values in this interval not displayed.   ------------------------------------------------------------------------------------------------------------------  Cardiac Enzymes Recent Labs  Lab 09/27/18 0640  TROPONINI 0.04*   ------------------------------------------------------------------------------------------------------------------  RADIOLOGY:  Dg Chest Port 1 View  Result Date: 09/29/2018 CLINICAL DATA:  Acute respiratory failure EXAM: PORTABLE CHEST 1 VIEW COMPARISON:  09/27/2018 FINDINGS: Cardiac enlargement. Bilateral airspace disease shows mild interval improvement. Progression of small right pleural effusion. Bibasilar atelectasis. IMPRESSION: Mild improvement in bilateral airspace disease most consistent with congestive heart failure. Progression of small right pleural effusion. Electronically Signed   By: Franchot Gallo M.D.   On: 09/29/2018 08:20   Dg Chest [facility] 1 View  Result  Date: 09/28/2018 CLINICAL DATA:  Respiratory failure EXAM: PORTABLE CHEST 1  VIEW COMPARISON:  Yesterday FINDINGS: Cardiac enlargement with diffuse interstitial opacity and small pleural effusions by prior radiograph. No air bronchogram. No pneumothorax. IMPRESSION: CHF pattern without interval improvement. Electronically Signed   By: Monte Fantasia M.D.   On: 09/28/2018 06:53    EKG:   Orders placed or performed in visit on 02/27/04  . EKG 12-Lead    ASSESSMENT AND PLAN:   73 year old female with past medical history significant for GERD, hypertension, osteoporosis and arthritis, anemia of chronic disease presents to hospital secondary to worsening shortness of breath over few weeks now.  1.  Acute on chronic anemia-denies any active bleeding.  Hemoglobin was noted to be at 2.6 on admission. -hemoglobin was 11 in 2017 -Anemia panel ordered, significantly low ferritin levels.  With normal iron levels not sure if it was drawn after blood transfusion. -Patient received 4 units packed RBC transfusion and hemoglobin is 9.6  -appreciate hematology consult  - GI consult as outpatient once respiratory condition is stable  2.  Acute respiratory failure secondary to hypoxia-secondary to acute CHF exacerbation. -  not on home oxygen.  Elevated BNP and also chest x-ray with significant pulmonary edema -On BiPAP on admission, now changed to high flow nasal cannula at 30% FiO2 -Could have been triggered by anemia -Echocardiogram with global weakness, EF of 20 to 25%.  Diffuse hypokinesis.  Cardiac cath  -May be  outpatient cardiac -Continue IV Lasix -Appreciate cardiology consult -Also has underlying COPD. -CHF medications like Coreg, losartan, Aldactone and Lasix have been ordered  3.  Hypertension-on IV Lasix.  Also on losartan  4.Hypothyroidism- synthroid  5. DVT prophylaxis- TEDs and SCDs only  6.  Hypokalemia-secondary to Lasix.  Replace  Independent at baseline PT consult today  Case manager consult as patient does not have a place to go as her house is  being foreclosed     All the records are reviewed and case discussed with Care Management/Social Workerr. Management plans discussed with the patient, family and they are in agreement.  CODE STATUS: Full code  TOTAL TIME TAKING CARE OF THIS PATIENT: 36 minutes.   POSSIBLE D/C IN 2-3 DAYS, DEPENDING ON CLINICAL CONDITION.   Gladstone Lighter M.D on 09/29/2018 at 12:48 PM  Between 7am to 6pm - Pager - [phone]  After 6pm go to [email or web address] - password [email or web address] [facility]  Office  [phone]  CC: Primary care physician; Lavera Guise, MD

## 2018-09-30 ENCOUNTER — Encounter: Payer: Self-pay | Admitting: Radiology

## 2018-09-30 ENCOUNTER — Other Ambulatory Visit: Payer: Self-pay

## 2018-09-30 ENCOUNTER — Inpatient Hospital Stay: Payer: PPO

## 2018-09-30 DIAGNOSIS — J9621 Acute and chronic respiratory failure with hypoxia: Secondary | ICD-10-CM

## 2018-09-30 DIAGNOSIS — R634 Abnormal weight loss: Secondary | ICD-10-CM

## 2018-09-30 DIAGNOSIS — J9622 Acute and chronic respiratory failure with hypercapnia: Secondary | ICD-10-CM

## 2018-09-30 LAB — CBC WITH DIFFERENTIAL/PLATELET
Abs Immature Granulocytes: 0.03 10*3/uL (ref 0.00–0.07)
Basophils Absolute: 0 10*3/uL (ref 0.0–0.1)
Basophils Relative: 0 %
Eosinophils Absolute: 0 10*3/uL (ref 0.0–0.5)
Eosinophils Relative: 0 %
HCT: 31.7 % — ABNORMAL LOW (ref 36.0–46.0)
Hemoglobin: 9.5 g/dL — ABNORMAL LOW (ref 12.0–15.0)
Immature Granulocytes: 0 %
Lymphocytes Relative: 18 %
Lymphs Abs: 1.3 10*3/uL (ref 0.7–4.0)
MCH: 21.9 pg — ABNORMAL LOW (ref 26.0–34.0)
MCHC: 30 g/dL (ref 30.0–36.0)
MCV: 73.2 fL — ABNORMAL LOW (ref 80.0–100.0)
MONOS PCT: 14 %
Monocytes Absolute: 1 10*3/uL (ref 0.1–1.0)
Neutro Abs: 5.1 10*3/uL (ref 1.7–7.7)
Neutrophils Relative %: 68 %
Platelets: 314 10*3/uL (ref 150–400)
RBC: 4.33 MIL/uL (ref 3.87–5.11)
RDW: 29.7 % — ABNORMAL HIGH (ref 11.5–15.5)
WBC: 7.6 10*3/uL (ref 4.0–10.5)
nRBC: 0.7 % — ABNORMAL HIGH (ref 0.0–0.2)

## 2018-09-30 LAB — BASIC METABOLIC PANEL
Anion gap: 8 (ref 5–15)
BUN: 16 mg/dL (ref 8–23)
CHLORIDE: 105 mmol/L (ref 98–111)
CO2: 27 mmol/L (ref 22–32)
Calcium: 8.9 mg/dL (ref 8.9–10.3)
Creatinine, Ser: 0.57 mg/dL (ref 0.44–1.00)
GFR calc Af Amer: >60 mL/min (ref >60)
GFR calc non Af Amer: >60 mL/min (ref >60)
Glucose, Bld: 113 mg/dL — ABNORMAL HIGH (ref 70–99)
Potassium: 4.5 mmol/L (ref 3.5–5.1)
Sodium: 140 mmol/L (ref 135–145)

## 2018-09-30 MED ORDER — ADULT MULTIVITAMIN W/MINERALS CH
1.0000 | ORAL_TABLET | Freq: Every day | ORAL | Status: DC
  Administered 2018-10-01: 1 via ORAL
  Filled 2018-09-30: qty 1

## 2018-09-30 MED ORDER — IOPAMIDOL (ISOVUE-300) INJECTION 61%
30.0000 mL | Freq: Once | INTRAVENOUS | Status: AC
  Administered 2018-09-30: 30 mL via ORAL

## 2018-09-30 MED ORDER — FUROSEMIDE 40 MG PO TABS
40.0000 mg | ORAL_TABLET | Freq: Every day | ORAL | Status: DC
  Administered 2018-09-30 – 2018-10-01 (×2): 40 mg via ORAL
  Filled 2018-09-30 (×2): qty 1

## 2018-09-30 MED ORDER — IOHEXOL 300 MG/ML  SOLN
75.0000 mL | Freq: Once | INTRAMUSCULAR | Status: AC | PRN
  Administered 2018-09-30: 19:00:00 75 mL via INTRAVENOUS

## 2018-09-30 MED ORDER — ESCITALOPRAM OXALATE 10 MG PO TABS
10.0000 mg | ORAL_TABLET | Freq: Every day | ORAL | Status: DC
  Administered 2018-09-30 – 2018-10-01 (×2): 10 mg via ORAL
  Filled 2018-09-30 (×2): qty 1

## 2018-09-30 MED ORDER — IPRATROPIUM-ALBUTEROL 0.5-2.5 (3) MG/3ML IN SOLN
3.0000 mL | Freq: Three times a day (TID) | RESPIRATORY_TRACT | Status: DC
  Administered 2018-10-01: 08:00:00 3 mL via RESPIRATORY_TRACT
  Filled 2018-09-30: qty 3

## 2018-09-30 MED ORDER — PANTOPRAZOLE SODIUM 40 MG PO TBEC
40.0000 mg | DELAYED_RELEASE_TABLET | Freq: Two times a day (BID) | ORAL | Status: DC
  Administered 2018-09-30 – 2018-10-01 (×2): 40 mg via ORAL
  Filled 2018-09-30 (×2): qty 1

## 2018-09-30 MED ORDER — ENSURE ENLIVE PO LIQD
237.0000 mL | Freq: Two times a day (BID) | ORAL | Status: DC
  Administered 2018-09-30 – 2018-10-01 (×3): 237 mL via ORAL

## 2018-09-30 MED ORDER — MIRTAZAPINE 15 MG PO TABS
30.0000 mg | ORAL_TABLET | Freq: Every day | ORAL | Status: DC
  Administered 2018-09-30: 20:00:00 30 mg via ORAL
  Filled 2018-09-30: qty 2

## 2018-09-30 MED ORDER — SODIUM CHLORIDE 0.9 % IV SOLN
200.0000 mg | Freq: Once | INTRAVENOUS | Status: AC
  Administered 2018-09-30: 14:00:00 200 mg via INTRAVENOUS
  Filled 2018-09-30: qty 10

## 2018-09-30 MED ORDER — POTASSIUM CHLORIDE CRYS ER 20 MEQ PO TBCR
20.0000 meq | EXTENDED_RELEASE_TABLET | Freq: Every day | ORAL | Status: DC
  Administered 2018-10-01: 20 meq via ORAL
  Filled 2018-09-30: qty 1

## 2018-09-30 NOTE — Progress Notes (Signed)
Trenda Moots   DOB:June 29, 1946   GY#:[id number]    Subjective: Patient is currently on the floor.  Shortness of breath improved patient is currently off oxygen.  Denies any chest pain.  Objective:  Vitals:   09/30/18 0952 09/30/18 1542  BP: 112/71 95/62  Pulse: 74 82  Resp:  16  Temp:  98.3 F (36.8 C)  SpO2:  93%     Intake/Output Summary (Last 24 hours) at 09/30/2018 1702 Last data filed at 09/30/2018 0500 Gross per 24 hour  Intake -  Output 500 ml  Net -500 ml    Physical Exam  Constitutional: She is oriented to person, place, and time and well-developed, well-nourished, and in no distress.  Thin cachectic female patient  HENT:  Head: Normocephalic and atraumatic.  Mouth/Throat: Oropharynx is clear and moist. No oropharyngeal exudate.  Eyes: Pupils are equal, round, and reactive to light.  Neck: Normal range of motion. Neck supple.  Cardiovascular: Normal rate and regular rhythm.  Pulmonary/Chest: No respiratory distress. She has no wheezes.  Abdominal: Soft. Bowel sounds are normal. She exhibits no distension and no mass. There is no abdominal tenderness. There is no rebound and no guarding.  Musculoskeletal: Normal range of motion.        General: No tenderness or edema.  Neurological: She is alert and oriented to person, place, and time.  Skin: Skin is warm. There is pallor.  Psychiatric: Affect normal.      Labs:  Lab Results  Component Value Date   WBC 7.6 09/30/2018   HGB 9.5 (L) 09/30/2018   HCT 31.7 (L) 09/30/2018   MCV 73.2 (L) 09/30/2018   PLT 314 09/30/2018   NEUTROABS 5.1 09/30/2018    Lab Results  Component Value Date   NA 140 09/30/2018   K 4.5 09/30/2018   CL 105 09/30/2018   CO2 27 09/30/2018    Studies:  Dg Chest Port 1 View  Result Date: 09/29/2018 CLINICAL DATA:  Acute respiratory failure EXAM: PORTABLE CHEST 1 VIEW COMPARISON:  09/27/2018 FINDINGS: Cardiac enlargement. Bilateral airspace disease shows mild interval improvement.  Progression of small right pleural effusion. Bibasilar atelectasis. IMPRESSION: Mild improvement in bilateral airspace disease most consistent with congestive heart failure. Progression of small right pleural effusion. Electronically Signed   By: Franchot Gallo M.D.   On: 09/29/2018 08:20    Symptomatic anemia 73 year old female patient with prior history of iron deficient anemia is currently admitted to hospital for severe anemia/acute congestive heart failure/respiratory failure  #Severe symptomatic iron deficiency anemia hemoglobin at presentation 2.6; status post multiple runs of blood transfusion hemoglobin stable at 9.3.  Proceed with IV iron infusion.  #Etiology of iron deficiency-unclear at this time.  Recommend CT of the abdomen pelvis with contrast to rule out obvious malignancies.  Given this significant weight loss.  #Acute respiratory failure/acute congestive heart failure ejection fraction 25 to 30%.  Improving.  Discussed with the [facility].  Dr. Janese Banks on call over the weekend for any immediate concerns.     Cammie Sickle, MD 09/30/2018  5:02 PM

## 2018-09-30 NOTE — Progress Notes (Addendum)
[place] at [place] NAME: Dawn Carpenter    MR#:  [id number]  DATE OF BIRTH:  1946/02/16  SUBJECTIVE:  CHIEF COMPLAINT:   Chief Complaint  Patient presents with  . Shortness of Breath   -Doing much better.  Very malnourished -Only on 2 L oxygen this morning.  Breathing is improved -Physical therapy consult is pending  REVIEW OF SYSTEMS:  Review of Systems  Constitutional: Positive for malaise/fatigue. Negative for chills, diaphoresis and fever.  HENT: Negative for congestion, ear discharge, hearing loss and nosebleeds.   Eyes: Negative for blurred vision and double vision.  Respiratory: Positive for shortness of breath. Negative for cough and wheezing.   Cardiovascular: Negative for chest pain, palpitations and leg swelling.  Gastrointestinal: Negative for abdominal pain, constipation, diarrhea, nausea and vomiting.  Genitourinary: Negative for dysuria.  Musculoskeletal: Negative for myalgias.  Neurological: Negative for dizziness, focal weakness, seizures, weakness and headaches.  Psychiatric/Behavioral: Negative for depression.    DRUG ALLERGIES:   Allergies  Allergen Reactions  . Wellbutrin [Bupropion] Other (See Comments)    Generic form.    VITALS:  Blood pressure 92/64, pulse 63, temperature 97.9 F (36.6 C), temperature source Oral, resp. rate 16, height 5\' 4"  (1.626 m), weight 47 kg, SpO2 98 %.  PHYSICAL EXAMINATION:  Physical Exam   GENERAL:  73 y.o.-year-old thin built and ill nourished patient lying in the bed, not in any acute distress.  EYES: Pupils equal, round, reactive to light and accommodation. No scleral icterus. Extraocular muscles intact.  HEENT: Head atraumatic, normocephalic. Oropharynx and nasopharynx clear.  NECK:  Supple, no jugular venous distention. No thyroid enlargement, no tenderness.  LUNGS: Normal breath sounds bilaterally, no wheezing, rhonchi or crepitation. No use of accessory muscles  of respiration.  Fine bibasilar crackles noted CARDIOVASCULAR: S1, S2 normal. No  rubs, or gallops.  2/6 systolic murmur is auscultated ABDOMEN: Soft, nontender, nondistended. Bowel sounds present. No organomegaly or mass.  EXTREMITIES: No pedal edema, cyanosis, or clubbing.  NEUROLOGIC: Cranial nerves II through XII are intact. Muscle strength 5/5 in all extremities. Sensation intact. Gait not checked.  Global weakness PSYCHIATRIC: The patient is alert and oriented x 3.  SKIN: No obvious rash, lesion, or ulcer.    LABORATORY PANEL:   CBC Recent Labs  Lab 09/29/18 0456  WBC 9.9  HGB 9.6*  HCT 29.8*  PLT 301   ------------------------------------------------------------------------------------------------------------------  Chemistries  Recent Labs  Lab 09/28/18 0459  09/30/18 0258  NA 137   < > 140  K 2.9*   < > 4.5  CL 103   < > 105  CO2 22   < > 27  GLUCOSE 122*   < > 113*  BUN 13   < > 16  CREATININE 0.59   < > 0.57  CALCIUM 8.3*   < > 8.9  MG 1.9  --   --    < > = values in this interval not displayed.   ------------------------------------------------------------------------------------------------------------------  Cardiac Enzymes Recent Labs  Lab 09/27/18 0640  TROPONINI 0.04*   ------------------------------------------------------------------------------------------------------------------  RADIOLOGY:  Dg Chest Port 1 View  Result Date: 09/29/2018 CLINICAL DATA:  Acute respiratory failure EXAM: PORTABLE CHEST 1 VIEW COMPARISON:  09/27/2018 FINDINGS: Cardiac enlargement. Bilateral airspace disease shows mild interval improvement. Progression of small right pleural effusion. Bibasilar atelectasis. IMPRESSION: Mild improvement in bilateral airspace disease most consistent with congestive heart failure. Progression of small right pleural effusion. Electronically Signed   By: Juanda Crumble  Carlis Abbott M.D.   On: 09/29/2018 08:20    EKG:   Orders placed or performed  in visit on 02/27/04  . EKG 12-Lead    ASSESSMENT AND PLAN:   73 year old female with past medical history significant for GERD, hypertension, osteoporosis and arthritis, anemia of chronic disease presents to hospital secondary to worsening shortness of breath over few weeks now.  1.  Acute on chronic anemia-denies any active bleeding.  Hemoglobin was noted to be at 2.6 on admission. -hemoglobin was 11 in 2017 - significantly low ferritin levels.  With normal iron levels- may be it was drawn after blood transfusion. -Patient received 4 units packed RBC transfusion this admission and hemoglobin is stable and greater than 9 -appreciate hematology consult  - GI consult as outpatient once respiratory condition is stable  2.  Acute respiratory failure secondary to hypoxia-secondary to acute CHF exacerbation. -  not on home oxygen.  Elevated BNP and also chest x-ray with significant pulmonary edema -On BiPAP on admission, also required high flow nasal cannula and currently only on 2 L oxygen. -Wean as tolerated -Could have been triggered by anemia -Echocardiogram with global weakness, EF of 20 to 25%.  Diffuse hypokinesis.  Cardiac cath may be as outpatient -on IV Lasix- change to oral lasix today -Appreciate cardiology consult -Also has underlying COPD. -CHF medications like Coreg, losartan, Aldactone and Lasix have been ordered  3.  Hypertension-on IV Lasix.  Also on losartan  4.Hypothyroidism- synthroid  5. DVT prophylaxis- TEDs and SCDs only  6.  Hypokalemia-secondary to Lasix.  Replace  7.  Severe malnutrition-also severely anemic on presentation.  Given her smoking history and significant anemia on admission with no active bleeding.  Agree with oncology, will need work-up including CT of the chest abdomen and pelvis.  Independent at baseline, wean off oxygen PT consult    Case manager consult as patient does not have a place to go as her house is being foreclosed Will need home  health at discharge     All the records are reviewed and case discussed with Care Management/Social Workerr. Management plans discussed with the patient, family and they are in agreement.  CODE STATUS: Full code  TOTAL TIME TAKING CARE OF THIS PATIENT: 37 minutes.   POSSIBLE D/C IN 1-2 DAYS, DEPENDING ON CLINICAL CONDITION.   Gladstone Lighter M.D on 09/30/2018 at 9:15 AM  Between 7am to 6pm - Pager - [phone]  After 6pm go to [email or web address] - password [email or web address] [facility]  Office  [phone]  CC: Primary care physician; Lavera Guise, MD

## 2018-09-30 NOTE — Progress Notes (Signed)
Progress Note  Patient Name: Dawn Carpenter. Date of Encounter: 09/30/2018  Primary Cardiologist: Ida Rogue, MD   Subjective   Patient reports she is feeling well today. She denies any cardiac complaints of CP, palpitations or feeling of racing HR. She does admit to feeling SOB / dizzy when sitting straight up, which she noticed while eating breakfast this AM. She reports improvement in her SOB. She does report a productive cough but states she swallows it so has not noted the color, as well as that "she is legally blind, so would not be able to see it." She denies any nausea, emesis, diarrhea. She has not yet ambulated around the room, given she feels symptoms of pre-syncope when sitting up straight in bed.   Inpatient Medications    Scheduled Meds: . budesonide (PULMICORT) nebulizer solution  0.25 mg Nebulization BID  . carvedilol  3.125 mg Oral BID WC  . docusate sodium  100 mg Oral BID  . furosemide  20 mg Intravenous Q12H  . ipratropium-albuterol  3 mL Nebulization Q6H  . levothyroxine  50 mcg Oral QAC breakfast  . losartan  12.5 mg Oral Daily  . pantoprazole (PROTONIX) IV  40 mg Intravenous Q12H  . potassium chloride  40 mEq Oral TID  . spironolactone  12.5 mg Oral Daily   Continuous Infusions:  PRN Meds: acetaminophen **OR** acetaminophen, heparin lock flush, heparin lock flush, [DISCONTINUED] ondansetron **OR** ondansetron (ZOFRAN) IV, sodium chloride flush, sodium chloride flush, traZODone   Vital Signs    Vitals:   09/29/18 1901 09/29/18 2033 09/29/18 2049 09/30/18 0341  BP: 111/71 110/75  92/64  Pulse: 75 60  63  Resp: 18 15  16   Temp:  97.9 F (36.6 C)  97.9 F (36.6 C)  TempSrc:  Oral  Oral  SpO2: 98% 93% 94% 98%  Weight:      Height:        Intake/Output Summary (Last 24 hours) at 09/30/2018 0907 Last data filed at 09/30/2018 0500 Gross per 24 hour  Intake 120 ml  Output 500 ml  Net -380 ml   Filed Weights   09/27/18 0008 09/27/18 0630  09/28/18 0500  Weight: 48.5 kg 49.4 kg 47 kg    Telemetry    SR, 60s-70s with faster rates corresponding to when moving around in the bed - Personally Reviewed  ECG    No new tracings - Personally Reviewed  Physical Exam   GEN: No acute distress.  Finishing breakfast Neck: No JVD Cardiac: RRR, distant heart sounds. Soft 1/6 systolic murmur. no murmurs, rubs, or gallops.  Respiratory: Coarse bilateral breath sounds, Scattered rhonchi. Bibasilar crackles, bibasilar reduced breath sounds. GI: Soft, nontender, non-distended  MS: No bilateral lower extremity edema; No deformity. Neuro:  Nonfocal  Psych: Normal affect   Labs    Chemistry Recent Labs  Lab 09/28/18 0459 09/28/18 2043 09/29/18 0456 09/30/18 0258  NA 137  --  139 140  K 2.9* 2.9* 3.2* 4.5  CL 103  --  102 105  CO2 22  --  27 27  GLUCOSE 122*  --  91 113*  BUN 13  --  15 16  CREATININE 0.59  --  0.55 0.57  CALCIUM 8.3*  --  8.5* 8.9  GFRNONAA >60  --  >60 >60  GFRAA >60  --  >60 >60  ANIONGAP 12  --  10 8     Hematology Recent Labs  Lab 09/27/18 0132  09/27/18 2139 09/28/18 0459 09/29/18  0456  WBC 7.1  --   --  12.9* 9.9  RBC 1.81*  --   --  3.37* 4.33  HGB 2.6*   < > 7.5* 7.2* 9.6*  HCT 10.4*   < > 23.5* 23.0* 29.8*  MCV 57.5*  --   --  68.2* 68.8*  MCH 14.4*  --   --  21.4* 22.2*  MCHC 25.0*  --   --  31.3 32.2  RDW 24.5*  --   --  29.0* 27.9*  PLT 311  --   --  301 301   < > = values in this interval not displayed.    Cardiac Enzymes Recent Labs  Lab 09/27/18 0012 09/27/18 0640  TROPONINI 0.04* 0.04*   No results for input(s): TROPIPOC in the last 168 hours.   BNP Recent Labs  Lab 09/27/18 0613 09/28/18 0459 09/29/18 0456  BNP 1,456.0* 4,051.0* 3,089.0*     DDimer No results for input(s): DDIMER in the last 168 hours.   Radiology    Dg Chest Port 1 View  Result Date: 09/29/2018 CLINICAL DATA:  Acute respiratory failure EXAM: PORTABLE CHEST 1 VIEW COMPARISON:  09/27/2018  FINDINGS: Cardiac enlargement. Bilateral airspace disease shows mild interval improvement. Progression of small right pleural effusion. Bibasilar atelectasis. IMPRESSION: Mild improvement in bilateral airspace disease most consistent with congestive heart failure. Progression of small right pleural effusion. Electronically Signed   By: Franchot Gallo M.D.   On: 09/29/2018 08:20    Cardiac Studies   2D Echocardiogram3.10.2020  1. The left ventricle has severely reduced systolic function, with an ejection fraction of 25-30%. The cavity size was normal. There is mildly increased left ventricular wall thickness. Left ventricular diastolic Doppler parameters are consistent with  pseudonormalization. Left ventrical global hypokinesis without regional wall motion abnormalities. 2. The right ventricle has normal systolic function. The cavity was normal. There is no increase in right ventricular wall thickness. Right ventricular systolic pressure is moderately elevated with an estimated pressure of 46.5 mmHg. 3. Left atrial size was mildly dilated. 4. Mild stenosis of the aortic valve. Degree of stenosis may be underestimated secondary to depressed EF. 5. Mitral valve regurgitation is moderate to severe 6. Tricuspid valve regurgitation is moderate. 7. Left pleural effusion noted   Patient Profile     73 y.o. female with h/o HTN, tobacco abuse, prior GIB, anemia, RA, OA, GERD, and who was admitted 3/10-2/2 for progressive dyspnea and found to have Hgb 2.6, CHF on xray, and EF 25-30%.  Assessment & Plan    New Onset Acute HFrEF  - SOB. Current smoker. In the setting of severe anemia as below and s/p transfusion. No previous h/o HF or cardiac disease. CXR with cardiac enlargement. BNP elevated at 1456.0 on admission and has continued to remain elevated since admission. Echo as above with severely reduced EF 25-30%, hypokinesis without RWMA. Bilateral pleural effusions with L>R. Elevated  pulmonary pressures. - Will need to continue balance diuresis with severe anemia and low output HF. Renal function continues to be stable with soft BP for vitals. Continue diuresis, BB, ARB, and spiro. Limited by BP at this time and cannot transition to [facility]. She is a poor candidate for cardiac catheterization at this time.  Acute Respiratory failure / hypoxia, in setting of current tobacco abuse with COPD  - As above, h/o COPD, current smoker, and with severe anemia and low output HFrEF with echo as above. Degree of AS unknown at this time but could also contribute to  SOB. Continue steroids, nebs per IM. Smoking cessation advised.  Aortic Stenosis - As in echo report above, difficult to assess in setting of low output HFrEF.   Severe anemia with h/o GERD and GI bleed - Hgb 2.9  6.3  9.9 and s/p transfusion. H/o GI bleed. Daily CBC. Continue PPI. Hematology following.   Elevated troponin, supply demand ischemia - No CP. Tn minimally elevated at 0.04 x2 at admit. EKG without acute changes. Consider supply demand ischemia in the setting of severe anemia and respiratory distress/hypoxia, as well as tachycardic on presentation with severely reduced EF. Continue medical management as above. No further ischemic workup at this time.  HTN - Controlled. Soft BP with SBP 90s. PTA losartan restarted in ICU, continue as BP and renal function allow. As above, will need to balance lasix / diuresis with severely low output HF and while monitor renal function. Recommend daily BMET, CBC. Renal function stable at this time.  Hypokalemia - K 3.2 yesterday and pending today's labs, ordered this AM by myself. Continue Spironolactone, potassium supplementation. Mg 1.9. Daily BMET ordered.  Hypothyroid - Continue synthroid. TSH 2.3.   For questions or updates, please contact [place] Please consult [email or web address] for contact info under        Signed, Arvil Chaco, PA-C  09/30/2018, 9:07  AM

## 2018-09-30 NOTE — Evaluation (Signed)
Physical Therapy Evaluation Patient Details Name: Dawn Carpenter MRN: [id number] DOB: 1945-09-17 Today's Date: 09/30/2018   History of Present Illness  73 y/o female with history of hypertension, hypothyroidism, GERD and GI bleeding presents to the emergency department complaining of shortness of breath.  On arrival her hemoglobin was found to be 2.6, transfusions have increased this to the 9s at time of eval.  Clinical Impression  Pt did well with PT exam and gait training around the nurses' station with RW.  She does not typically need/use AD but did not do well with brief trail without UE use, and was clearly safer and more confident with it.  She is not at her baseline and would benefit from continued PT services at home (wherever that may be) to work back toward her baseline functional status.  Pt was fatigue with the 200 ft bout of ambulation, but O2 stayed in the 90s on room air and HR did not exceed 100 bpm.    Follow Up Recommendations Home health PT    Equipment Recommendations  Rolling walker with 5" wheels    Recommendations for Other Services       Precautions / Restrictions Restrictions Weight Bearing Restrictions: No      Mobility  Bed Mobility Overal bed mobility: Modified Independent             General bed mobility comments: Pt was able to get to sitting at EOB w/o assist, very little hesitation  Transfers Overall transfer level: Modified independent Equipment used: Rolling walker (2 wheeled)             General transfer comment: Pt able to rise w/o assist, good confidence and no overt safety issues though she was reliant on walker to maintain balance  Ambulation/Gait Ambulation/Gait assistance: Supervision Gait Distance (Feet): 200 Feet Assistive device: Rolling walker (2 wheeled)       General Gait Details: We tried to do a brief trial w/o AD (holding hallway rail and then no UEs), she was much slower and more unsteady.  Pt, however, did  very well with walker showing consistent cadence and good overall safety with AD.   Stairs            Wheelchair Mobility    Modified Rankin (Stroke Patients Only)       Balance Overall balance assessment: Modified Independent(needed AD in standing)                                           Pertinent Vitals/Pain Pain Assessment: No/denies pain    Home Living Family/patient expects to be discharged to:: Unsure Living Arrangements: Alone               Additional Comments: Pt's home recently foreclosed, may be able to go to daughter's home?    Prior Function Level of Independence: Independent         Comments: Pt is near blind and does not drive, has assist from daughter and others for errands, groceries, etc     Hand Dominance        Extremity/Trunk Assessment   Upper Extremity Assessment Upper Extremity Assessment: Generalized weakness    Lower Extremity Assessment Lower Extremity Assessment: Generalized weakness       Communication   Communication: No difficulties  Cognition Arousal/Alertness: Awake/alert Behavior During Therapy: WFL for tasks assessed/performed Overall Cognitive Status: Within Functional Limits for  tasks assessed                                        General Comments      Exercises     Assessment/Plan    PT Assessment Patient needs continued PT services  PT Problem List Decreased strength;Decreased range of motion;Decreased activity tolerance;Decreased balance;Decreased mobility;Decreased knowledge of use of DME;Decreased safety awareness;Cardiopulmonary status limiting activity       PT Treatment Interventions DME instruction;Gait training;Functional mobility training;Stair training;Therapeutic activities;Therapeutic exercise;Neuromuscular re-education;Balance training;Patient/family education    PT Goals (Current goals can be found in the Care Plan section)  Acute Rehab PT  Goals Patient Stated Goal: figure out where she can live PT Goal Formulation: With patient Time For Goal Achievement: 10/14/18 Potential to Achieve Goals: Good    Frequency Min 2X/week   Barriers to discharge        Co-evaluation               AM-PAC PT "6 Clicks" Mobility  Outcome Measure Help needed turning from your back to your side while in a flat bed without using bedrails?: None Help needed moving from lying on your back to sitting on the side of a flat bed without using bedrails?: None Help needed moving to and from a bed to a chair (including a wheelchair)?: None Help needed standing up from a chair using your arms (e.g., wheelchair or bedside chair)?: None Help needed to walk in hospital room?: None Help needed climbing 3-5 steps with a railing? : A Little 6 Click Score: 23    End of Session Equipment Utilized During Treatment: Gait belt Activity Tolerance: Patient tolerated treatment well Patient left: with bed alarm set;with call bell/phone within reach Nurse Communication: Mobility status PT Visit Diagnosis: Muscle weakness (generalized) (M62.81);Difficulty in walking, not elsewhere classified (R26.2);Unsteadiness on feet (R26.81)    Time: 3888-2800 PT Time Calculation (min) (ACUTE ONLY): 24 min   Charges:   PT Evaluation $PT Eval Low Complexity: 1 Low PT Treatments $Gait Training: 8-22 mins        Kreg Shropshire, DPT 09/30/2018, 3:22 PM

## 2018-09-30 NOTE — Care Management Important Message (Signed)
Important Message  Patient Details  Name: Dawn Carpenter MRN: [id number] Date of Birth: [birth year]   Medicare Important Message Given:  Yes    Juliann Pulse A Kalyani Maeda 09/30/2018, 1:05 PM

## 2018-10-01 LAB — BASIC METABOLIC PANEL
Anion gap: 10 (ref 5–15)
BUN: 13 mg/dL (ref 8–23)
CO2: 27 mmol/L (ref 22–32)
Calcium: 9.1 mg/dL (ref 8.9–10.3)
Chloride: 101 mmol/L (ref 98–111)
Creatinine, Ser: 0.56 mg/dL (ref 0.44–1.00)
GFR calc non Af Amer: >60 mL/min (ref >60)
Glucose, Bld: 98 mg/dL (ref 70–99)
Potassium: 3.4 mmol/L — ABNORMAL LOW (ref 3.5–5.1)
Sodium: 138 mmol/L (ref 135–145)

## 2018-10-01 LAB — CBC
HCT: 36.8 % (ref 36.0–46.0)
Hemoglobin: 11.1 g/dL — ABNORMAL LOW (ref 12.0–15.0)
MCH: 22.2 pg — ABNORMAL LOW (ref 26.0–34.0)
MCHC: 30.2 g/dL (ref 30.0–36.0)
MCV: 73.6 fL — ABNORMAL LOW (ref 80.0–100.0)
NRBC: 0.6 % — AB (ref 0.0–0.2)
Platelets: 338 10*3/uL (ref 150–400)
RBC: 5 MIL/uL (ref 3.87–5.11)
RDW: 30.6 % — ABNORMAL HIGH (ref 11.5–15.5)
WBC: 6.3 10*3/uL (ref 4.0–10.5)

## 2018-10-01 MED ORDER — ENSURE ENLIVE PO LIQD: 237.0000 mL | Freq: Two times a day (BID) | ORAL | 12 refills | Status: DC

## 2018-10-01 MED ORDER — ESCITALOPRAM OXALATE 10 MG PO TABS: 10.0000 mg | ORAL_TABLET | Freq: Every day | ORAL | 0 refills | Status: DC

## 2018-10-01 MED ORDER — IPRATROPIUM-ALBUTEROL 0.5-2.5 (3) MG/3ML IN SOLN: 3.0000 mL | Freq: Two times a day (BID) | RESPIRATORY_TRACT | Status: DC

## 2018-10-01 MED ORDER — CARVEDILOL 3.125 MG PO TABS: 3.1250 mg | ORAL_TABLET | Freq: Two times a day (BID) | ORAL | 0 refills | Status: DC

## 2018-10-01 MED ORDER — POTASSIUM CHLORIDE CRYS ER 20 MEQ PO TBCR: 20.0000 meq | EXTENDED_RELEASE_TABLET | Freq: Every day | ORAL | 0 refills | Status: DC

## 2018-10-01 MED ORDER — SPIRONOLACTONE 25 MG PO TABS: 12.5000 mg | ORAL_TABLET | Freq: Every day | ORAL | 0 refills | Status: DC

## 2018-10-01 MED ORDER — FUROSEMIDE 40 MG PO TABS: 40.0000 mg | ORAL_TABLET | Freq: Every day | ORAL | 0 refills | Status: DC

## 2018-10-01 MED ORDER — LOSARTAN POTASSIUM 25 MG PO TABS: 12.5000 mg | ORAL_TABLET | Freq: Every day | ORAL | 0 refills | Status: DC

## 2018-10-01 MED ORDER — ALBUTEROL SULFATE (2.5 MG/3ML) 0.083% IN NEBU: 2.5000 mg | INHALATION_SOLUTION | RESPIRATORY_TRACT | Status: DC | PRN

## 2018-10-01 NOTE — Progress Notes (Addendum)
[place] [facility] [place] NAME: Dawn Carpenter    MR#:  [id number]  DATE OF BIRTH:  1945-08-18  SUBJECTIVE:  CHIEF COMPLAINT:   Chief Complaint  Patient presents with  . Shortness of Breath   In bed resting on RA no complaints. Feels "better". Attempting to get in contact with daughter to see if she can go home with her, patient's house is under foreclosure. CM and RN have also attempted to contact her all day with no success. Worked with PT yesterday, recommended HH PT with rolling walker.   REVIEW OF SYSTEMS:  Review of Systems  Constitutional: Negative for chills, diaphoresis and fever, malaise/fatigue.   HENT: Negative for congestion, ear discharge, hearing loss and nosebleeds.   Eyes: Negative for blurred vision and double vision.  Respiratory: Negative for cough and wheezing. Negative today for shortness of breath.   Cardiovascular: Negative for chest pain, palpitations and leg swelling.  Gastrointestinal: Negative for abdominal pain, constipation, diarrhea, nausea and vomiting.  Genitourinary: Negative for dysuria.  Musculoskeletal: Negative for myalgias.  Neurological: Negative for dizziness, focal weakness, seizures, weakness and headaches.  Psychiatric/Behavioral: Negative for depression.  DRUG ALLERGIES:   Allergies  Allergen Reactions  . Wellbutrin [Bupropion] Other (See Comments)    Generic form.   VITALS:  Blood pressure 114/82, pulse 99, temperature 98.8 F (37.1 C), temperature source Oral, resp. rate 16, height 5\' 4"  (1.626 m), weight 47 kg, SpO2 93 %. PHYSICAL EXAMINATION:  GENERAL:  73 y.o.-year-old cachectic-appearing patient lying in the bed, not in any acute distress.  EYES: Pupils equal, round, reactive to light and accommodation. No scleral icterus. Extraocular muscles intact.  HEENT: Head atraumatic, normocephalic. Oropharynx and nasopharynx clear.  NECK:  Supple, no jugular venous distention. No thyroid  enlargement, no tenderness.  LUNGS: Normal breath sounds bilaterally, no wheezing, rhonchi or crepitation. No use of accessory muscles of respiration. CARDIOVASCULAR: S1, S2 normal. No  rubs, or gallops.  2/6 systolic murmur is auscultated.  ABDOMEN: Soft, nontender, nondistended. Bowel sounds present. No organomegaly or mass.  EXTREMITIES: No pedal edema, cyanosis, or clubbing.  NEUROLOGIC: Cranial nerves II through XII are intact. Muscle strength 5/5 in all extremities. Sensation intact. Gait not checked.  Global weakness PSYCHIATRIC: The patient is alert and oriented x 3.  SKIN: No obvious rash, lesion, or ulcer.  LABORATORY PANEL:  Female CBC Recent Labs  Lab 10/01/18 0553  WBC 6.3  HGB 11.1*  HCT 36.8  PLT 338   ------------------------------------------------------------------------------------------------------------------ Chemistries  Recent Labs  Lab 09/28/18 0459  10/01/18 0553  NA 137   < > 138  K 2.9*   < > 3.4*  CL 103   < > 101  CO2 22   < > 27  GLUCOSE 122*   < > 98  BUN 13   < > 13  CREATININE 0.59   < > 0.56  CALCIUM 8.3*   < > 9.1  MG 1.9  --   --    < > = values in this interval not displayed.   RADIOLOGY:  Ct Abdomen Pelvis W Contrast  Result Date: 09/30/2018 CLINICAL DATA:  Inpatient. Iron deficiency anemia. Abnormal weight loss. CHF. Respiratory failure. EXAM: CT ABDOMEN AND PELVIS WITH CONTRAST TECHNIQUE: Multidetector CT imaging of the abdomen and pelvis was performed using the standard protocol following bolus administration of intravenous contrast. CONTRAST:  14mL OMNIPAQUE IOHEXOL 300 MG/ML  SOLN COMPARISON:  02/25/2016 PET-CT. FINDINGS: Motion degraded scan, limiting assessment. Lower  chest: Trace dependent bilateral pleural effusions with mild dependent bibasilar atelectasis. Mild cardiomegaly. Coronary atherosclerosis. Hepatobiliary: Normal liver size. Simple 3.7 cm posterior left liver lobe cyst. No additional liver lesions. Cholelithiasis. No  biliary ductal dilatation. Pancreas: Normal, with no mass or duct dilation. Spleen: Normal size. No mass. Adrenals/Urinary Tract: Normal right adrenal. Left adrenal 1.4 cm nodule is stable since 02/25/2016 PET-CT, where it was seen to represent an adenoma. No hydronephrosis. Simple 1.5 cm upper right renal cyst. Stable subcentimeter hypodense upper right renal cortical lesion is too small to characterize and requires no follow-up. Normal bladder. Stomach/Bowel: Small hiatal hernia. Otherwise normal nondistended stomach. Normal caliber small bowel with no small bowel wall thickening. Normal appendix. Normal large bowel with no diverticulosis, large bowel wall thickening or pericolonic fat stranding. Vascular/Lymphatic: Atherosclerotic nonaneurysmal abdominal aorta. Patent portal, splenic, hepatic and renal veins. No pathologically enlarged lymph nodes in the abdomen or pelvis. Reproductive: Status post hysterectomy, with no abnormal findings at the vaginal cuff. No adnexal mass. Other: No pneumoperitoneum, ascites or focal fluid collection. Musculoskeletal: No aggressive appearing focal osseous lesions. Marked lumbar spondylosis. IMPRESSION: 1. No lymphadenopathy or suspicious mass in the abdomen or pelvis. 2. Trace dependent bilateral pleural effusions. Mild cardiomegaly. Coronary atherosclerosis. 3. Small hiatal hernia. 4. Cholelithiasis. 5. Stable left adrenal adenoma. 6.  Aortic Atherosclerosis (ICD10-I70.0). Electronically Signed   By: Ilona Sorrel M.D.   On: 09/30/2018 20:51   ASSESSMENT AND PLAN:   73 year old female with past medical history significant for GERD, hypertension, osteoporosis and arthritis, anemia of chronic disease presents to hospital secondary to worsening shortness of breath over few weeks now.  1.  Acute on chronic anemia-denies any active bleeding.  Hemoglobin was noted to be at 2.6 on admission. -hemoglobin was 11 in 2017 - significantly low ferritin levels.  With normal iron  levels- may be it was drawn after blood transfusion. -Patient received 4 units packed RBC transfusion this admission and hemoglobin is stable and greater than 9 today - appreciate hematology consult  - she may need an occult blood card completed, unfortunately this was not done inpatient though it was ordered - GI follow-up as outpatient once respiratory condition is stable - she has a history of adenomatous polyps, continues to smoke, has unexplained weight loss. May benefit from consideration of colonoscopy or upper endoscopy, at specialist discretion.  Follow up closely with PCP consider need for iron infusions outpatient, hematology consult at PCP discretion  2.  Acute respiratory failure secondary to hypoxia-secondary to acute CHF exacerbation. -  not on home oxygen.  Elevated BNP and also chest x-ray with significant pulmonary edema -On BiPAP on admission, also required high flow nasal cannula and currently only on 2 L oxygen. -Wean as tolerated, on room air today -Could have been triggered by anemia -Echocardiogram with global weakness, EF of 20 to 25%.  Diffuse hypokinesis.  Cardiac cath maybe as outpatient - follow up with outpatient cardiology -on IV Lasix- change to oral lasix today. Continue -Appreciate cardiology consult - Also has underlying COPD. -CHF medications like Coreg, losartan, Aldactone and Lasix have been ordered  3.  Hypertension-on PO Lasix.  Also on losartan  4. Hypothyroidism- synthroid  5. DVT prophylaxis- TEDs and SCDs only due to anemia  6.  Hypokalemia-secondary to Lasix.  Replaced.  7.  Severe malnutrition-also severely anemic on presentation.  Given her smoking history and significant anemia on admission with no active bleeding.  Agree with oncology, will need work-up including CT of the chest abdomen  and pelvis - done and did not show any concerning abnormalities.   Independent at baseline, weaned off oxygen PT consulted -  HH PT with rolling  walker Case manager consult as patient does not have a place to go as her house is being foreclosed Will need home health RN at discharge   All the records are reviewed and case is discussed with Care Management/Social Worker. Management plans discussed with the patient and/or family and they are in agreement.  CODE STATUS: Full Code  TOTAL TIME TAKING CARE OF THIS PATIENT: 30 minutes.   More than 50% of the time was spent in counseling/coordination of care: YES  POSSIBLE D/C IN 1 DAYS, DEPENDING ON PLACEMENT/FAMILY CONTACT.   Cabria Micalizzi PA-C on 10/01/2018 at 3:54 PM  Between 7am to 6pm - Pager - [phone]  After 6 pm go to [email or web address] - password [email or web address] [facility]  [facility] [place] [facility]  Office  [phone]  CC: Primary care physician; Lavera Guise, MD  Note: This dictation was prepared with Dragon dictation along with smaller phrase technology. Any transcriptional errors that result from this process are unintentional.

## 2018-10-01 NOTE — Discharge Instructions (Signed)
Please follow-up with Dr. Rockey Situ (cardiology) in 2 weeks for further cardiac work-up.  Follow up with your PCP within 1 week to discuss ongoing management of your anemia. They may decide to refer you to a specialist (a hematology doctor) for consideration of iron infusions. Continue taking your iron supplement.  Follow up with a gastroenterology doctor in 2 weeks to assess the need for colonoscopy or other interventions to work-up your unexplained weight loss and anemia.   Numbers for these providers have been provided in your paperwork.   Medications: continue Coreg 3.125 mg twice daily, losartan 12.5 mg daily, low-dose spironolactone, Lasix 40 mg daily. Please be sure to follow-up with Cardiology as above for repeat blood-work.

## 2018-10-01 NOTE — Discharge Summary (Signed)
[facility] NAME: Dawn Carpenter    MR#:  [id number]  DATE OF BIRTH:  1946-01-30  DATE OF ADMISSION:  09/27/2018   ADMITTING PHYSICIAN: Harrie Foreman, MD  DATE OF DISCHARGE: 10/01/2018  5:57 PM  PRIMARY CARE PHYSICIAN: Lavera Guise, MD   ADMISSION DIAGNOSIS:  Acute respiratory failure with hypoxia (HCC) [J96.01] Symptomatic anemia [D64.9] Acute congestive heart failure, unspecified heart failure type ([place]) [V76.1] Acute systolic heart failure ([place]) [I50.21] DISCHARGE DIAGNOSIS:  Active Problems:   Acute systolic heart failure (HCC)   Symptomatic anemia  SECONDARY DIAGNOSIS:   Past Medical History:  Diagnosis Date   Anemia    Anxiety    Arthritis    rheumatoid   Depression    GERD (gastroesophageal reflux disease)    Hemorrhoids    Hypertension    Osteoporosis    Thyroid disease    hypothyoridism   HOSPITAL COURSE:   73 year old female with past medical history significant for GERD, hypertension, osteoporosis and arthritis, anemia of chronic disease presented to hospital secondary to worsening shortness of breath over a few weeks.  1. Acute on chronic anemia-denies any active bleeding. Hemoglobin was noted to be at 2.6 on admission. -hemoglobin was 11 in 2017 - significantly low ferritin levels. With normal iron levels- may beit was drawn after blood transfusion. -Patient received 4 units packed RBC transfusionthis admissionand hemoglobin isstable and greater than 9 today - appreciate hematology consult  - she may need an occult blood card completed outpatient, unfortunately this was not done inpatient though it was ordered and active - GI follow-up as outpatient once respiratory condition is stable - she has a history of adenomatous polyps, continues to smoke, has unexplained weight loss. May benefit from consideration of colonoscopy or upper endoscopy, at specialist discretion.  - counseled  on smoking cessation, will need further counseling with PCP: Follow up closely with PCP consider need for iron infusions outpatient, hematology referral outpatient at PCP discretion  2. Acute respiratory failure secondary to hypoxia-secondary to acute CHF exacerbation. - not on home oxygen. Elevated BNP and also chest x-ray with significant pulmonary edema -On BiPAP on admission,also required high flow nasal cannula and currently only on 2 L oxygen. -Weaned as tolerated, on room air today -Could have been triggered by anemia -Echocardiogram with global weakness, EF of 20 to 25%. Diffuse hypokinesis. Cardiac cath maybe as outpatient - follow up with outpatient cardiology -onIV Lasix- changed to oral lasix. Continue -Appreciate cardiology consult while admitted - Also has underlying COPD. -CHF medications like Coreg, losartan, Aldactone and Lasix have been ordered  3. Hypertension-on PO Lasix. Also on losartan  4. Hypothyroidism- synthroid  5. DVT prophylaxis- TEDs and SCDs only due to anemia  6. Hypokalemia-secondary to Lasix. Replaced.  7. Severe malnutrition-also severely anemic on presentation. Given her smoking history and significant anemia on admission with no active bleeding. Agreed with oncology, will need work-up including CT of the chest abdomen and pelvis - done and did not show any concerning abnormalities.   Independent at baseline,weaned off oxygen PT consulted -  HH PT with rolling walker, ordered Case manager consult as patient does not have a place to go as her house is being foreclosed needed home health RN at discharge, ordered.  On day of discharge there was difficulty contacting her daughter. All efforts were made by CM to set up [facility] services given this situation, after-hours communication with CM Angela via Dr. Manuella Ghazi to  facilitate discharge today after he spoke with daughter [name].  Daughter provided with CM contact info for any help needed setting  up [facility] services. Script for walker provided, which was ordered previously. Per Dr. Manuella Ghazi medically-cleared for discharge.  DISCHARGE CONDITIONS:  stable CONSULTS OBTAINED:  Treatment Team:  Cammie Sickle, MD DRUG ALLERGIES:   Allergies  Allergen Reactions   Wellbutrin [Bupropion] Other (See Comments)    Generic form.   DISCHARGE MEDICATIONS:   Allergies as of 10/01/2018      Reactions   Wellbutrin [bupropion] Other (See Comments)   Generic form.      Medication List    TAKE these medications   ALPRAZolam 0.25 MG tablet Commonly known as:  XANAX Take 0.5 tablets (0.125 mg total) by mouth 2 (two) times daily as needed.   Biotin 1 MG Caps Take 1 tablet by mouth daily.   calcium carbonate 500 MG chewable tablet Commonly known as:  TUMS - dosed in mg elemental calcium Chew 1 tablet by mouth daily.   carvedilol 3.125 MG tablet Commonly known as:  COREG Take 1 tablet (3.125 mg total) by mouth 2 (two) times daily with a meal for 30 days.   escitalopram 10 MG tablet Commonly known as:  LEXAPRO Take 1 tablet (10 mg total) by mouth daily. Start taking on:  October 02, 2018 What changed:  when to take this   feeding supplement (ENSURE ENLIVE) Liqd Take 237 mLs by mouth 2 (two) times daily between meals.   furosemide 40 MG tablet Commonly known as:  LASIX Take 1 tablet (40 mg total) by mouth daily for 30 days. Start taking on:  October 01, 4625   Hematinic/Folic Acid 035-0 MG Tabs Generic drug:  Ferrous Fumarate-Folic Acid Take 1 tablet by mouth daily.   levothyroxine 50 MCG tablet Commonly known as:  SYNTHROID, LEVOTHROID Take 1 tablet (50 mcg total) by mouth daily before breakfast.   losartan 25 MG tablet Commonly known as:  COZAAR Take 0.5 tablets (12.5 mg total) by mouth daily for 30 days. Start taking on:  October 02, 2018 What changed:    medication strength  how much to take   mirtazapine 30 MG tablet Commonly known as:  REMERON Take 1 tablet (30 mg  total) by mouth at bedtime.   multivitamin capsule Take 1 capsule by mouth daily.   omeprazole 40 MG capsule Commonly known as:  PRILOSEC Take 1 capsule (40 mg total) by mouth daily.   potassium chloride SA 20 MEQ tablet Commonly known as:  K-DUR,KLOR-CON Take 1 tablet (20 mEq total) by mouth daily for 30 days. Start taking on:  October 02, 2018   spironolactone 25 MG tablet Commonly known as:  ALDACTONE Take 0.5 tablets (12.5 mg total) by mouth daily for 30 days. Start taking on:  October 02, 2018            Durable Medical Equipment  (From admission, onward)         Start     Ordered   10/01/18 1401  For home use only DME Walker rolling  [facility])  Once    Question:  Patient needs a walker to treat with the following condition  Answer:  Dependent on walker for ambulation   10/01/18 1401           DISCHARGE INSTRUCTIONS:   DIET:  Cardiac diet DISCHARGE CONDITION:  Stable ACTIVITY:  Activity as tolerated OXYGEN:  Home Oxygen: No.  Oxygen Delivery: room air DISCHARGE LOCATION:  Home with daughter [name] with intermittent supervision with HH PT, RN, and DME rolling walker ordered.   If you experience worsening of your admission symptoms, develop shortness of breath, life threatening emergency, suicidal or homicidal thoughts you must seek medical attention immediately by calling 911 or calling your MD immediately if your symptoms are severe.  You Must read complete instructions/literature along with all the possible adverse reactions/side effects for all the medicines you take and that have been prescribed to you. Take any new medicines only after you have completely understood and accept all the possible adverse reactions/side effects.   Please note  You were cared for by a hospitalist during your hospital stay. If you have any questions about your discharge medications or the care you received while you were in the hospital after you are discharged, you can call  the unit and asked to speak with the hospitalist on call if the hospitalist that took care of you is not available. Once you are discharged, your primary care physician will handle any further medical issues. Please note that NO REFILLS for any discharge medications will be authorized once you are discharged, as it is imperative that you return to your primary care physician (or establish a relationship with a primary care physician if you do not have one) for your aftercare needs so that they can reassess your need for medications and monitor your lab values.    On the day of Discharge:  VITAL SIGNS:  Blood pressure 114/82, pulse 99, temperature 98.8 F (37.1 C), temperature source Oral, resp. rate 16, height 5\' 4"  (1.626 m), weight 47 kg, SpO2 93 %. PHYSICAL EXAMINATION:  GENERAL: 73 y.o.-year-oldcachectic-appearingpatient lying in the bed, not in any acute distress.  EYES: Pupils equal, round, reactive to light and accommodation. No scleral icterus. Extraocular muscles intact.  HEENT: Head atraumatic, normocephalic. Oropharynx and nasopharynx clear.  NECK: Supple, no jugular venous distention. No thyroid enlargement, no tenderness.  LUNGS: Normal breath sounds bilaterally, no wheezing, rhonchi or crepitation. No use of accessory muscles of respiration. CARDIOVASCULAR: S1, S2 normal. No rubs, or gallops. 2/6 systolic murmur is auscultated.  ABDOMEN: Soft, nontender, nondistended. Bowel sounds present. No organomegaly or mass.  EXTREMITIES: No pedal edema, cyanosis, or clubbing.  NEUROLOGIC: Cranial nerves II through XII are intact. Muscle strength 4/5 in all extremities. Sensation intact. Gait not checked. Global weakness PSYCHIATRIC: The patient is alert and oriented x 3.  SKIN: No obvious rash, lesion, or ulcer.  DATA REVIEW:   CBC Recent Labs  Lab 10/01/18 0553  WBC 6.3  HGB 11.1*  HCT 36.8  PLT 338    Chemistries  Recent Labs  Lab 09/28/18 0459  10/01/18 0553  NA 137    < > 138  K 2.9*   < > 3.4*  CL 103   < > 101  CO2 22   < > 27  GLUCOSE 122*   < > 98  BUN 13   < > 13  CREATININE 0.59   < > 0.56  CALCIUM 8.3*   < > 9.1  MG 1.9  --   --    < > = values in this interval not displayed.    RADIOLOGY:  Ct Abdomen Pelvis W Contrast  Result Date: 09/30/2018 CLINICAL DATA:  Inpatient. Iron deficiency anemia. Abnormal weight loss. CHF. Respiratory failure. EXAM: CT ABDOMEN AND PELVIS WITH CONTRAST TECHNIQUE: Multidetector CT imaging of the abdomen and pelvis was performed using the standard protocol following bolus administration of intravenous  contrast. CONTRAST:  55mL OMNIPAQUE IOHEXOL 300 MG/ML  SOLN COMPARISON:  02/25/2016 PET-CT. FINDINGS: Motion degraded scan, limiting assessment. Lower chest: Trace dependent bilateral pleural effusions with mild dependent bibasilar atelectasis. Mild cardiomegaly. Coronary atherosclerosis. Hepatobiliary: Normal liver size. Simple 3.7 cm posterior left liver lobe cyst. No additional liver lesions. Cholelithiasis. No biliary ductal dilatation. Pancreas: Normal, with no mass or duct dilation. Spleen: Normal size. No mass. Adrenals/Urinary Tract: Normal right adrenal. Left adrenal 1.4 cm nodule is stable since 02/25/2016 PET-CT, where it was seen to represent an adenoma. No hydronephrosis. Simple 1.5 cm upper right renal cyst. Stable subcentimeter hypodense upper right renal cortical lesion is too small to characterize and requires no follow-up. Normal bladder. Stomach/Bowel: Small hiatal hernia. Otherwise normal nondistended stomach. Normal caliber small bowel with no small bowel wall thickening. Normal appendix. Normal large bowel with no diverticulosis, large bowel wall thickening or pericolonic fat stranding. Vascular/Lymphatic: Atherosclerotic nonaneurysmal abdominal aorta. Patent portal, splenic, hepatic and renal veins. No pathologically enlarged lymph nodes in the abdomen or pelvis. Reproductive: Status post hysterectomy, with  no abnormal findings at the vaginal cuff. No adnexal mass. Other: No pneumoperitoneum, ascites or focal fluid collection. Musculoskeletal: No aggressive appearing focal osseous lesions. Marked lumbar spondylosis. IMPRESSION: 1. No lymphadenopathy or suspicious mass in the abdomen or pelvis. 2. Trace dependent bilateral pleural effusions. Mild cardiomegaly. Coronary atherosclerosis. 3. Small hiatal hernia. 4. Cholelithiasis. 5. Stable left adrenal adenoma. 6.  Aortic Atherosclerosis (ICD10-I70.0). Electronically Signed   By: Ilona Sorrel M.D.   On: 09/30/2018 20:51    Management plans discussed with the patient and/or family and they are in agreement.  CODE STATUS: Full Code   TOTAL TIME TAKING CARE OF THIS PATIENT: 45 minutes.    Ripley Fraise PA-C on 10/01/2018 at 6:27 PM  Between 7am to 6pm - Pager - [phone]  After 6pm go to [email or web address] - password [email or web address] [place] [facility]  Office  [phone]  CC: Primary care physician; Lavera Guise, MD

## 2018-10-01 NOTE — Care Management (Signed)
This RNCM received a call from Dr. Manuella Ghazi requesting a discharge to home with home health at 1730. Weekend Consulting civil engineer had left message earlier in the day for Dawn Carpenter/daughter and she had not returned call.  SOmeone was able to reach daughter this evening and therefore MD shared that patient was ready for discharge. Daughter has arrive to pick patient up. Unfortunately, representatives with home health and ambulatory equipment have left for the day as has the weekend care manager. I spoke with RN and patient's daughter Dawn Carpenter by phone (that is now at patient bedside) and explained the situation. Patient was concerned that she may have co-pays and does not want to accept services.  RN and Dawn Carpenter notified that patient will need Rx for walker to take to [name] or pharmacy that may be open at this time to fill walker Rx- they acknowledged.  Patient indicated that she wasn't sure if she wants a walker. This RNCM gave Dawn Carpenter the weekend care managers direct phone number to assist with both services (homehealth and walker) in the morning after 0800AM. Message update sent to both Dr. Manuella Ghazi and Weekend RN with care management. Patient will be staying with Dawn Carpenter temporarily [place].  Phone [phone]. Dawn Carpenter shared that it is only temporary and that patient "has capacity to make bad decisions" and indicated that patient would not be staying with her long.  Per Dawn Carpenter patient is no longer requiring supplementary Oxygen however she was on high flow Oxygen on 09/29/18 during a brief stay in ICU.

## 2018-10-02 NOTE — TOC Transition Note (Signed)
Transition of Care [facility]) - CM/SW Discharge Note   Patient Details  Name: Dawn Carpenter MRN: [id number] Date of Birth: [birth year]  Transition of Care [facility]) CM/SW Contact:  Latanya Maudlin, RN Phone Number: 10/02/2018, 11:46 AM   Clinical Narrative:   Patient to be discharged per MD order. Orders in place for home health services. CMS Medicare.gov Compare Post Acute Care list reviewed with patient and she has no preferene of agency. Referral placed with Malachy Mood from [place]. Several staff members have attempted to call the patients daughter to work on discharge transportation and home health location as patient was in a hotel before. Daughter called up to the hospital after [facility] team had left Saturday night. Home health was established but a rolling walker had not been delivered. Patient was discharged to her daughters home. THis RNCM notified Chjeryl from [facility] of discharge and she plans to schedule home health visit at updated daughters address. Daughter was given a prescription for a rolling walker and she plans to obtain one from a drug store or medical supply store. I spoke at length with daughter, [name] this am to inform her of the plans for home health and the need to obtain a walker. Karis in agreement with the plan.     Final next level of care: Home w Home Health Services Barriers to Discharge: No Barriers Identified   Patient Goals and CMS Choice   CMS Medicare.gov Compare Post Acute Care list provided to:: Patient Choice offered to / list presented to : Patient  Discharge Placement                       Discharge Plan and Services Discharge Planning Services: CM Consult Post Acute Care Choice: Home Health              HH Arranged: RN, PT, Nurse's Aide [place] Agency: [place]   Social Determinants of Health (SDOH) Interventions     Readmission Risk Interventions No flowsheet data found.

## 2018-10-04 LAB — CBC WITH DIFFERENTIAL/PLATELET
ABS IMMATURE GRANULOCYTES: 0.04 10*3/uL (ref 0.00–0.07)
Basophils Absolute: 0 10*3/uL (ref 0.0–0.1)
Basophils Relative: 0 %
Eosinophils Absolute: 0 10*3/uL (ref 0.0–0.5)
Eosinophils Relative: 0 %
HEMATOCRIT: 10.4 % — AB (ref 36.0–46.0)
Hemoglobin: 2.6 g/dL — CL (ref 12.0–15.0)
Immature Granulocytes: 1 %
LYMPHS PCT: 6 %
Lymphs Abs: 0.4 10*3/uL — ABNORMAL LOW (ref 0.7–4.0)
MCH: 14.4 pg — ABNORMAL LOW (ref 26.0–34.0)
MCHC: 25 g/dL — ABNORMAL LOW (ref 30.0–36.0)
MCV: 57.5 fL — ABNORMAL LOW (ref 80.0–100.0)
MONO ABS: 0.6 10*3/uL (ref 0.1–1.0)
Monocytes Relative: 8 %
NEUTROS ABS: 6 10*3/uL (ref 1.7–7.7)
Neutrophils Relative %: 85 %
Platelets: 311 10*3/uL (ref 150–400)
RBC: 1.81 MIL/uL — AB (ref 3.87–5.11)
RDW: 24.5 % — ABNORMAL HIGH (ref 11.5–15.5)
WBC: 7.1 10*3/uL (ref 4.0–10.5)
nRBC: 0 % (ref 0.0–0.2)

## 2018-10-31 ENCOUNTER — Other Ambulatory Visit: Payer: Self-pay

## 2018-10-31 DIAGNOSIS — E039 Hypothyroidism, unspecified: Secondary | ICD-10-CM

## 2018-10-31 MED ORDER — LEVOTHYROXINE SODIUM 50 MCG PO TABS: 50.0000 ug | ORAL_TABLET | Freq: Every day | ORAL | 1 refills | Status: DC

## 2018-11-01 ENCOUNTER — Telehealth: Payer: Self-pay

## 2018-11-01 ENCOUNTER — Encounter: Payer: Self-pay | Admitting: Nurse Practitioner

## 2018-11-01 ENCOUNTER — Other Ambulatory Visit: Payer: Self-pay

## 2018-11-01 ENCOUNTER — Ambulatory Visit (INDEPENDENT_AMBULATORY_CARE_PROVIDER_SITE_OTHER): Payer: PPO | Admitting: Nurse Practitioner

## 2018-11-01 VITALS — BP 158/89 | HR 91 | Resp 16 | Ht 64.0 in | Wt 96.2 lb

## 2018-11-01 DIAGNOSIS — I509 Heart failure, unspecified: Secondary | ICD-10-CM | POA: Diagnosis not present

## 2018-11-01 DIAGNOSIS — K219 Gastro-esophageal reflux disease without esophagitis: Secondary | ICD-10-CM | POA: Diagnosis not present

## 2018-11-01 DIAGNOSIS — I10 Essential (primary) hypertension: Secondary | ICD-10-CM | POA: Diagnosis not present

## 2018-11-01 DIAGNOSIS — D509 Iron deficiency anemia, unspecified: Secondary | ICD-10-CM | POA: Diagnosis not present

## 2018-11-01 DIAGNOSIS — F411 Generalized anxiety disorder: Secondary | ICD-10-CM | POA: Diagnosis not present

## 2018-11-01 DIAGNOSIS — F321 Major depressive disorder, single episode, moderate: Secondary | ICD-10-CM

## 2018-11-01 MED ORDER — FERROUS FUMARATE-FOLIC ACID 324-1 MG PO TABS: 1.0000 | ORAL_TABLET | Freq: Every day | ORAL | 5 refills | Status: DC

## 2018-11-01 MED ORDER — LOSARTAN POTASSIUM 25 MG PO TABS: 12.5000 mg | ORAL_TABLET | Freq: Every day | ORAL | 5 refills | Status: DC

## 2018-11-01 MED ORDER — ALPRAZOLAM 0.25 MG PO TABS: 0.1250 mg | ORAL_TABLET | Freq: Two times a day (BID) | ORAL | 3 refills | Status: DC | PRN

## 2018-11-01 MED ORDER — MIRTAZAPINE 30 MG PO TABS: 30.0000 mg | ORAL_TABLET | Freq: Every day | ORAL | 5 refills | Status: DC

## 2018-11-01 MED ORDER — OMEPRAZOLE 40 MG PO CPDR: 40.0000 mg | DELAYED_RELEASE_CAPSULE | Freq: Every day | ORAL | 5 refills | Status: DC

## 2018-11-01 MED ORDER — ESCITALOPRAM OXALATE 10 MG PO TABS: 10.0000 mg | ORAL_TABLET | Freq: Every day | ORAL | 5 refills | Status: DC

## 2018-11-01 NOTE — Telephone Encounter (Signed)
Lmom with [facility] we place order for  Home health

## 2018-11-01 NOTE — Progress Notes (Deleted)
[facility], [place] [address]  Internal MEDICINE  Office Visit Note  Patient Name: Dawn Carpenter  [id number]  [id number]  Date of Service: 11/01/2018  Chief Complaint  Patient presents with  . Follow-up  . Anxiety  . Hypertension  . Depression    HPI     Current Medication: Outpatient Encounter Medications as of 11/01/2018  Medication Sig  . ALPRAZolam (XANAX) 0.25 MG tablet Take 0.5 tablets (0.125 mg total) by mouth 2 (two) times daily as needed.  . Biotin 1 MG CAPS Take 1 tablet by mouth daily.  . calcium carbonate (TUMS - DOSED IN MG ELEMENTAL CALCIUM) 500 MG chewable tablet Chew 1 tablet by mouth daily.  Marland Kitchen escitalopram (LEXAPRO) 10 MG tablet Take 1 tablet (10 mg total) by mouth daily.  . feeding supplement, ENSURE ENLIVE, (ENSURE ENLIVE) LIQD Take 237 mLs by mouth 2 (two) times daily between meals.  . Ferrous Fumarate-Folic Acid (HEMATINIC/FOLIC ACID) 017-4 MG TABS Take 1 tablet by mouth daily.   . furosemide (LASIX) 40 MG tablet Take 1 tablet (40 mg total) by mouth daily for 30 days.  Marland Kitchen levothyroxine (SYNTHROID, LEVOTHROID) 50 MCG tablet Take 1 tablet (50 mcg total) by mouth daily before breakfast.  . losartan (COZAAR) 25 MG tablet Take 0.5 tablets (12.5 mg total) by mouth daily for 30 days.  . mirtazapine (REMERON) 30 MG tablet Take 1 tablet (30 mg total) by mouth at bedtime.  . Multiple Vitamin (MULTIVITAMIN) capsule Take 1 capsule by mouth daily.  Marland Kitchen omeprazole (PRILOSEC) 40 MG capsule Take 1 capsule (40 mg total) by mouth daily.  . potassium chloride SA (K-DUR,KLOR-CON) 20 MEQ tablet Take 1 tablet (20 mEq total) by mouth daily for 30 days.  Marland Kitchen spironolactone (ALDACTONE) 25 MG tablet Take 0.5 tablets (12.5 mg total) by mouth daily for 30 days.  . carvedilol (COREG) 3.125 MG tablet Take 1 tablet (3.125 mg total) by mouth 2 (two) times daily with a meal for 30 days.   No facility-administered encounter medications on file as of  11/01/2018.     Surgical History: Past Surgical History:  Procedure Laterality Date  . ABDOMINAL HYSTERECTOMY    . COLONOSCOPY    . COLONOSCOPY WITH PROPOFOL N/A 05/15/2015   Procedure: COLONOSCOPY WITH PROPOFOL;  Surgeon: Manya Silvas, MD;  Location: [facility] ENDOSCOPY;  Service: Endoscopy;  Laterality: N/A;  . ESOPHAGOGASTRODUODENOSCOPY (EGD) WITH PROPOFOL N/A 05/15/2015   Procedure: ESOPHAGOGASTRODUODENOSCOPY (EGD) WITH PROPOFOL;  Surgeon: Manya Silvas, MD;  Location: [facility] ENDOSCOPY;  Service: Endoscopy;  Laterality: N/A;  . HAND SURGERY     for arthritis  . UPPER GI ENDOSCOPY      Medical History: Past Medical History:  Diagnosis Date  . Anemia   . Anxiety   . Arthritis    rheumatoid  . Depression   . GERD (gastroesophageal reflux disease)   . Hemorrhoids   . Hypertension   . Osteoporosis   . Thyroid disease    hypothyoridism    Family History: Family History  Problem Relation Age of Onset  . Colon cancer Father   . Heart disease Father   . Hypertension Mother   . Breast cancer Neg Hx     Social History   Socioeconomic History  . Marital status: Single    Spouse name: Not on file  . Number of children: Not on file  . Years of education: Not on file  . Highest education level: Not on file  Occupational History  .  Not on file  Social Needs  . Financial resource strain: Not very hard  . Food insecurity:    Worry: Patient refused    Inability: Patient refused  . Transportation needs:    Medical: Patient refused    Non-medical: Patient refused  Tobacco Use  . Smoking status: Current Every Day Smoker    Packs/day: 0.50    Years: 40.00    Pack years: 20.00    Types: Cigarettes  . Smokeless tobacco: Never Used  . Tobacco comment: Hx of 1 PPD from 73 years old until 01/2016  Substance and Sexual Activity  . Alcohol use: No    Frequency: Never  . Drug use: No  . Sexual activity: Not Currently  Lifestyle  . Physical activity:    Days per week:  Patient refused    Minutes per session: Patient refused  . Stress: Only a little  Relationships  . Social connections:    Talks on phone: Patient refused    Gets together: Patient refused    Attends religious service: Patient refused    Active member of club or organization: Patient refused    Attends meetings of clubs or organizations: Patient refused    Relationship status: Patient refused  . Intimate partner violence:    Fear of current or ex partner: Patient refused    Emotionally abused: Patient refused    Physically abused: Patient refused    Forced sexual activity: Patient refused  Other Topics Concern  . Not on file  Social History Narrative  . Not on file      Review of Systems  Vital Signs: BP (!) 158/89   Pulse 91   Resp 16   Ht 5\' 4"  (1.626 m)   Wt 96 lb 3.2 oz (43.6 kg)   SpO2 98%   BMI 16.51 kg/m    Physical Exam     Assessment/Plan:   General Counseling: Erilyn verbalizes understanding of the findings of todays visit and agrees with plan of treatment. I have discussed any further diagnostic evaluation that may be needed or ordered today. We also reviewed her medications today. she has been encouraged to call the office with any questions or concerns that should arise related to todays visit.    No orders of the defined types were placed in this encounter.   No orders of the defined types were placed in this encounter.   Time spent:*** Minutes      Dr Lavera Guise Internal medicine

## 2018-11-02 DIAGNOSIS — I509 Heart failure, unspecified: Secondary | ICD-10-CM | POA: Insufficient documentation

## 2018-11-02 NOTE — Progress Notes (Signed)
[facility] [place], [place] [address]  Internal MEDICINE  Office Visit Note  Patient Name: Dawn Carpenter  [id number]  [id number]  Date of Service: 11/02/2018   Pt is here for recent hospital follow up.   Chief Complaint  Patient presents with  . Follow-up  . Anxiety  . Hypertension  . Depression     There patient was recently hospitlaized for CHF and severe anemia. Upon admission, her Hgbwas 2.6. She had to receive four units of blood over her stay. Her Hgb did come up to 11.2 prior to her discharge. Has been recommended she see hematology for further evaluation as she may benefit from iron infusions after discharge. A sample for occult blood in stool was ordered while she was hospitalized, however, the sample was never obtained .she was referred to GI for further evaluation, her daughter has to make appointment with that practice. She was also referred to cardiologist upon discharge and that initial appointment still needs to be made. The patient had home health orders for nursing and physical therapy, however, the family has not been contacted by any home health company at this time . I believe the patient may also benefit from social work concult. She is legally blind and is now living with her daughter and her daughter's children. They have many questions about medications and what they are for and how they should be taken. She does have trouble with balance and this is most severe when trying to get up and down stairs in her daughter's home.  The patient states that, overall, she feels ok. She continues to be fatigued and does have some shortness of breath with exertion. Blood pressure is slightly elevated today. She has not swelling in lower extremities or feet. She denies chest pain or pressure. Does have shortness of breath with exertion. She has no complaint of abdominal pain, constipation, or diarrhea.    Current Medication: Outpatient Encounter  Medications as of 11/01/2018  Medication Sig  . ALPRAZolam (XANAX) 0.25 MG tablet Take 0.5 tablets (0.125 mg total) by mouth 2 (two) times daily as needed.  . Biotin 1 MG CAPS Take 1 tablet by mouth daily.  . calcium carbonate (TUMS - DOSED IN MG ELEMENTAL CALCIUM) 500 MG chewable tablet Chew 1 tablet by mouth daily.  Marland Kitchen escitalopram (LEXAPRO) 10 MG tablet Take 1 tablet (10 mg total) by mouth daily.  . feeding supplement, ENSURE ENLIVE, (ENSURE ENLIVE) LIQD Take 237 mLs by mouth 2 (two) times daily between meals.  . Ferrous Fumarate-Folic Acid (HEMATINIC/FOLIC ACID) 627-0 MG TABS Take 1 tablet by mouth daily.  . furosemide (LASIX) 40 MG tablet Take 1 tablet (40 mg total) by mouth daily for 30 days.  Marland Kitchen levothyroxine (SYNTHROID, LEVOTHROID) 50 MCG tablet Take 1 tablet (50 mcg total) by mouth daily before breakfast.  . losartan (COZAAR) 25 MG tablet Take 0.5 tablets (12.5 mg total) by mouth daily.  . mirtazapine (REMERON) 30 MG tablet Take 1 tablet (30 mg total) by mouth at bedtime.  . Multiple Vitamin (MULTIVITAMIN) capsule Take 1 capsule by mouth daily.  Marland Kitchen omeprazole (PRILOSEC) 40 MG capsule Take 1 capsule (40 mg total) by mouth daily.  . potassium chloride SA (K-DUR,KLOR-CON) 20 MEQ tablet Take 1 tablet (20 mEq total) by mouth daily for 30 days.  Marland Kitchen spironolactone (ALDACTONE) 25 MG tablet Take 0.5 tablets (12.5 mg total) by mouth daily for 30 days.  . [DISCONTINUED] ALPRAZolam (XANAX) 0.25 MG tablet Take 0.5 tablets (0.125  mg total) by mouth 2 (two) times daily as needed.  . [DISCONTINUED] escitalopram (LEXAPRO) 10 MG tablet Take 1 tablet (10 mg total) by mouth daily.  . [DISCONTINUED] Ferrous Fumarate-Folic Acid (HEMATINIC/FOLIC ACID) 650-3 MG TABS Take 1 tablet by mouth daily.   . [DISCONTINUED] losartan (COZAAR) 25 MG tablet Take 0.5 tablets (12.5 mg total) by mouth daily for 30 days.  . [DISCONTINUED] mirtazapine (REMERON) 30 MG tablet Take 1 tablet (30 mg total) by mouth at bedtime.  .  [DISCONTINUED] omeprazole (PRILOSEC) 40 MG capsule Take 1 capsule (40 mg total) by mouth daily.  . carvedilol (COREG) 3.125 MG tablet Take 1 tablet (3.125 mg total) by mouth 2 (two) times daily with a meal for 30 days.   No facility-administered encounter medications on file as of 11/01/2018.     Surgical History: Past Surgical History:  Procedure Laterality Date  . ABDOMINAL HYSTERECTOMY    . COLONOSCOPY    . COLONOSCOPY WITH PROPOFOL N/A 05/15/2015   Procedure: COLONOSCOPY WITH PROPOFOL;  Surgeon: Manya Silvas, MD;  Location: [facility] ENDOSCOPY;  Service: Endoscopy;  Laterality: N/A;  . ESOPHAGOGASTRODUODENOSCOPY (EGD) WITH PROPOFOL N/A 05/15/2015   Procedure: ESOPHAGOGASTRODUODENOSCOPY (EGD) WITH PROPOFOL;  Surgeon: Manya Silvas, MD;  Location: [facility] ENDOSCOPY;  Service: Endoscopy;  Laterality: N/A;  . HAND SURGERY     for arthritis  . UPPER GI ENDOSCOPY      Medical History: Past Medical History:  Diagnosis Date  . Anemia   . Anxiety   . Arthritis    rheumatoid  . Depression   . GERD (gastroesophageal reflux disease)   . Hemorrhoids   . Hypertension   . Osteoporosis   . Thyroid disease    hypothyoridism    Family History: Family History  Problem Relation Age of Onset  . Colon cancer Father   . Heart disease Father   . Hypertension Mother   . Breast cancer Neg Hx     Social History   Socioeconomic History  . Marital status: Single    Spouse name: Not on file  . Number of children: Not on file  . Years of education: Not on file  . Highest education level: Not on file  Occupational History  . Not on file  Social Needs  . Financial resource strain: Not very hard  . Food insecurity:    Worry: Patient refused    Inability: Patient refused  . Transportation needs:    Medical: Patient refused    Non-medical: Patient refused  Tobacco Use  . Smoking status: Current Every Day Smoker    Packs/day: 0.50    Years: 40.00    Pack years: 20.00    Types:  Cigarettes  . Smokeless tobacco: Never Used  . Tobacco comment: Hx of 1 PPD from 73 years old until 01/2016  Substance and Sexual Activity  . Alcohol use: No    Frequency: Never  . Drug use: No  . Sexual activity: Not Currently  Lifestyle  . Physical activity:    Days per week: Patient refused    Minutes per session: Patient refused  . Stress: Only a little  Relationships  . Social connections:    Talks on phone: Patient refused    Gets together: Patient refused    Attends religious service: Patient refused    Active member of club or organization: Patient refused    Attends meetings of clubs or organizations: Patient refused    Relationship status: Patient refused  . Intimate partner violence:  Fear of current or ex partner: Patient refused    Emotionally abused: Patient refused    Physically abused: Patient refused    Forced sexual activity: Patient refused  Other Topics Concern  . Not on file  Social History Narrative  . Not on file      Review of Systems  Constitutional: Positive for activity change and fatigue. Negative for chills and unexpected weight change.  HENT: Negative for congestion, postnasal drip, rhinorrhea, sneezing and sore throat.   Respiratory: Positive for cough and shortness of breath. Negative for chest tightness.   Cardiovascular: Negative for chest pain, palpitations and leg swelling.       Elevated blood pressure today.  Gastrointestinal: Negative for abdominal pain, constipation, diarrhea, nausea and vomiting.  Endocrine: Negative for cold intolerance, heat intolerance, polydipsia and polyuria.  Musculoskeletal: Negative for arthralgias, back pain, joint swelling and neck pain.  Skin: Negative for rash.  Neurological: Positive for dizziness and weakness. Negative for tremors and numbness.  Hematological: Negative for adenopathy. Does not bruise/bleed easily.  Psychiatric/Behavioral: Negative for behavioral problems (Depression), sleep  disturbance and suicidal ideas. The patient is nervous/anxious.     Today's Vitals   11/01/18 1523  BP: (!) 158/89  Pulse: 91  Resp: 16  SpO2: 98%  Weight: 96 lb 3.2 oz (43.6 kg)  Height: 5\' 4"  (1.626 m)   Body mass index is 16.51 kg/m.  Physical Exam Vitals signs and nursing note reviewed.  Constitutional:      General: She is not in acute distress.    Appearance: Normal appearance. She is well-developed. She is not diaphoretic.  HENT:     Head: Normocephalic and atraumatic.     Mouth/Throat:     Pharynx: No oropharyngeal exudate.  Eyes:     Pupils: Pupils are equal, round, and reactive to light.  Neck:     Musculoskeletal: Normal range of motion and neck supple.     Thyroid: No thyromegaly.     Vascular: No JVD.     Trachea: No tracheal deviation.  Cardiovascular:     Rate and Rhythm: Normal rate. Rhythm irregular.     Heart sounds: Murmur present. No friction rub. No gallop.   Pulmonary:     Effort: Pulmonary effort is normal. No respiratory distress.     Breath sounds: No wheezing or rales.     Comments: Mildly congested breath sounds which clear with cough.  Chest:     Chest wall: No tenderness.  Abdominal:     General: Bowel sounds are normal.     Palpations: Abdomen is soft.  Musculoskeletal: Normal range of motion.  Lymphadenopathy:     Cervical: No cervical adenopathy.  Skin:    General: Skin is warm and dry.  Neurological:     Mental Status: She is alert and oriented to person, place, and time. Mental status is at baseline.     Cranial Nerves: No cranial nerve deficit.  Psychiatric:        Attention and Perception: Attention and perception normal.        Mood and Affect: Mood is anxious.        Speech: Speech normal.        Behavior: Behavior normal.        Thought Content: Thought content normal.        Cognition and Memory: Cognition and memory normal.        Judgment: Judgment normal.   Assessment/Plan: 1. Congestive heart failure, unspecified  HF chronicity, unspecified  heart failure type [facility]) Reviewed labs, images, and progress notes from recent hospitalization records. Patient should have consult with home health for nursing and physical therapy. She may also benefit from social work consultation. A face-to-face evaluation was completed tpdy and order submitted to home health company.  - Ambulatory referral to [facility]  2. Iron deficiency anemia, unspecified iron deficiency anemia type She should continue iron supplement every day. Refer to hematology for continued evaluation and treatment.  - Ferrous Fumarate-Folic Acid (HEMATINIC/FOLIC ACID) 458-0 MG TABS; Take 1 tablet by mouth daily.  Dispense: 30 each; Refill: 5 - Ambulatory referral to Hematology  3. Essential hypertension Continue losartan and other BP medication as prescribed  - losartan (COZAAR) 25 MG tablet; Take 0.5 tablets (12.5 mg total) by mouth daily.  Dispense: 15 tablet; Refill: 5  4. Gastroesophageal reflux disease without esophagitis - omeprazole (PRILOSEC) 40 MG capsule; Take 1 capsule (40 mg total) by mouth daily.  Dispense: 30 capsule; Refill: 5  5. Moderate major depression (HCC) - mirtazapine (REMERON) 30 MG tablet; Take 1 tablet (30 mg total) by mouth at bedtime.  Dispense: 30 tablet; Refill: 5  6. Generalized anxiety disorder Alprazolam 0.25mg  (1/2 to 1 tablet) may be taken twice daily if needed for acute anxiety. She should also continue lexapro as previously prescribed . - ALPRAZolam (XANAX) 0.25 MG tablet; Take 0.5 tablets (0.125 mg total) by mouth 2 (two) times daily as needed.  Dispense: 30 tablet; Refill: 3 - escitalopram (LEXAPRO) 10 MG tablet; Take 1 tablet (10 mg total) by mouth daily.  Dispense: 30 tablet; Refill: 5  General Counseling: amarii amy understanding of the findings of todays visit and agrees with plan of treatment. I have discussed any further diagnostic evaluation that may be needed or ordered today. We also reviewed  her medications today. she has been encouraged to call the office with any questions or concerns that should arise related to todays visit.    Counseling:  Cardiac risk factor modification:  1. Control blood pressure. 2. Exercise as prescribed. 3. Follow low sodium, low fat diet. and low fat and low cholestrol diet. 4. Take ASA 81mg  once a day. 5. Restricted calories diet to lose weight.  This patient was seen by Leretha Pol FNP Collaboration with Dr Lavera Guise as a part of collaborative care agreement  Orders Placed This Encounter  Procedures  . Ambulatory referral to Home Health  . Ambulatory referral to Hematology      I have reviewed all medical records from hospital follow up including radiology reports and consults from other physicians. Appropriate follow up diagnostics will be scheduled as needed. Patient/ Family understands the plan of treatment. Time spent 45 minutes.   Dr Lavera Guise, MD Internal Medicine

## 2018-11-04 DIAGNOSIS — F419 Anxiety disorder, unspecified: Secondary | ICD-10-CM | POA: Diagnosis not present

## 2018-11-04 DIAGNOSIS — H548 Legal blindness, as defined in USA: Secondary | ICD-10-CM | POA: Diagnosis not present

## 2018-11-04 DIAGNOSIS — M1990 Unspecified osteoarthritis, unspecified site: Secondary | ICD-10-CM | POA: Diagnosis not present

## 2018-11-04 DIAGNOSIS — F329 Major depressive disorder, single episode, unspecified: Secondary | ICD-10-CM | POA: Diagnosis not present

## 2018-11-04 DIAGNOSIS — I509 Heart failure, unspecified: Secondary | ICD-10-CM | POA: Diagnosis not present

## 2018-11-04 DIAGNOSIS — D649 Anemia, unspecified: Secondary | ICD-10-CM | POA: Diagnosis not present

## 2018-11-04 DIAGNOSIS — Z90710 Acquired absence of both cervix and uterus: Secondary | ICD-10-CM | POA: Diagnosis not present

## 2018-11-04 DIAGNOSIS — I110 Hypertensive heart disease with heart failure: Secondary | ICD-10-CM | POA: Diagnosis not present

## 2018-11-04 DIAGNOSIS — E039 Hypothyroidism, unspecified: Secondary | ICD-10-CM | POA: Diagnosis not present

## 2018-11-04 DIAGNOSIS — Z681 Body mass index (BMI) 19 or less, adult: Secondary | ICD-10-CM | POA: Diagnosis not present

## 2018-11-04 DIAGNOSIS — M069 Rheumatoid arthritis, unspecified: Secondary | ICD-10-CM | POA: Diagnosis not present

## 2018-11-04 DIAGNOSIS — M810 Age-related osteoporosis without current pathological fracture: Secondary | ICD-10-CM | POA: Diagnosis not present

## 2018-11-04 DIAGNOSIS — K219 Gastro-esophageal reflux disease without esophagitis: Secondary | ICD-10-CM | POA: Diagnosis not present

## 2018-11-04 DIAGNOSIS — F17210 Nicotine dependence, cigarettes, uncomplicated: Secondary | ICD-10-CM | POA: Diagnosis not present

## 2018-11-07 ENCOUNTER — Telehealth: Payer: Self-pay

## 2018-11-07 NOTE — Telephone Encounter (Signed)
Pt home health physical therapist called and said that her physical therapy went well today and that they will no longer be doing home health physical therapy. They may do one more consult if she moves to another place if needed.

## 2018-11-08 ENCOUNTER — Inpatient Hospital Stay: Payer: PPO | Attending: Internal Medicine | Admitting: Internal Medicine

## 2018-11-08 ENCOUNTER — Other Ambulatory Visit: Payer: Self-pay

## 2018-11-08 ENCOUNTER — Encounter: Payer: Self-pay | Admitting: Internal Medicine

## 2018-11-08 DIAGNOSIS — D509 Iron deficiency anemia, unspecified: Secondary | ICD-10-CM

## 2018-11-08 DIAGNOSIS — Z79899 Other long term (current) drug therapy: Secondary | ICD-10-CM

## 2018-11-08 DIAGNOSIS — D649 Anemia, unspecified: Secondary | ICD-10-CM

## 2018-11-08 NOTE — Progress Notes (Signed)
I connected with Dawn Mabee V. on 11/08/18 at 10:15 AM EDT by video enabled telemedicine visit and verified that I am speaking with the correct person using two identifiers.  I discussed the limitations, risks, security and privacy concerns of performing an evaluation and management service by telemedicine and the availability of in-person appointments. I also discussed with the patient that there may be a patient responsible charge related to this service. The patient expressed understanding and agreed to proceed.    Other persons participating in the visit and their role in the encounter: Daughter Patient's location: Home Provider's location: Home   No history exists.     Chief Complaint: Iron deficient anemia   History of present illness:Dawn Carpenter. 73 y.o.  female with history of severe diffuse anemia of unclear etiology.  Patient was recently admitted to hospital for congestive heart failure acute/noted to have a hemoglobin of 2.6.  Received multiple units of blood transfusion.  Was evaluated by GI however given tenuous respiratory status no endoscopies were done.  CT scans negative for any acute process.  Observation/objective: Hemoglobin  Assessment and plan: Symptomatic anemia #  Severe symptomatic iron deficiency anemia hemoglobin at presentation 2.6- [in March 2020]; most recent 11 at discharge from the hospital.  #Repeat labs in 1 week/proceed with IV iron infusion/Feraheme.  #Etiology of iron deficiency-unclear at this time-CT scan no acute process..  Recommend follow-up with GI regarding setting up of endoscopies.    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  Clinically stable.  #Disposition:  # In 1 week-CBC/iron studies ferritin/Feraheme #Follow-up with MD- 2 months/CBC-BMP Feraheme- Dr.B      Follow-up instructions:  I discussed the assessment and treatment plan with the  patient.  The patient was provided an opportunity to ask questions and all were answered.  The patient agreed with the plan and demonstrated understanding of instructions.  The patient was advised to call back or seek an in person evaluation if the symptoms worsen or if the condition fails to improve as anticipated.   Dr. Charlaine Dalton [place] at [facility] 11/08/2018 10:41 AM

## 2018-11-08 NOTE — Assessment & Plan Note (Addendum)
#    Severe symptomatic iron deficiency anemia hemoglobin at presentation 2.6- [in March 2020]; most recent 11 at discharge from the hospital.  #Repeat labs in 1 week/proceed with IV iron infusion/Feraheme.  #Etiology of iron deficiency-unclear at this time-CT scan no acute process..  Recommend follow-up with GI regarding setting up of endoscopies.    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  Clinically stable.  #Disposition:  # In 1 week-CBC/iron studies ferritin/Feraheme #Follow-up with MD- 2 months/CBC-BMP Feraheme- Dr.B

## 2018-11-10 ENCOUNTER — Telehealth: Payer: Self-pay

## 2018-11-10 NOTE — Telephone Encounter (Signed)
Spoke with pt daughter home health already start this week

## 2018-11-15 ENCOUNTER — Other Ambulatory Visit: Payer: Self-pay

## 2018-11-15 ENCOUNTER — Inpatient Hospital Stay: Payer: PPO

## 2018-11-15 VITALS — BP 170/94 | HR 70 | Temp 97.0°F | Resp 20

## 2018-11-15 DIAGNOSIS — D509 Iron deficiency anemia, unspecified: Secondary | ICD-10-CM | POA: Diagnosis not present

## 2018-11-15 DIAGNOSIS — D649 Anemia, unspecified: Secondary | ICD-10-CM

## 2018-11-15 LAB — BASIC METABOLIC PANEL
Anion gap: 7 (ref 5–15)
BUN: 17 mg/dL (ref 8–23)
CO2: 25 mmol/L (ref 22–32)
Calcium: 9 mg/dL (ref 8.9–10.3)
Chloride: 107 mmol/L (ref 98–111)
Creatinine, Ser: 0.47 mg/dL (ref 0.44–1.00)
GFR calc Af Amer: >60 mL/min (ref >60)
GFR calc non Af Amer: >60 mL/min (ref >60)
Glucose, Bld: 101 mg/dL — ABNORMAL HIGH (ref 70–99)
Potassium: 5.1 mmol/L (ref 3.5–5.1)
Sodium: 139 mmol/L (ref 135–145)

## 2018-11-15 LAB — CBC WITH DIFFERENTIAL/PLATELET
Abs Immature Granulocytes: 0.02 10*3/uL (ref 0.00–0.07)
Basophils Absolute: 0.1 10*3/uL (ref 0.0–0.1)
Basophils Relative: 1 %
Eosinophils Absolute: 0.2 10*3/uL (ref 0.0–0.5)
Eosinophils Relative: 5 %
HCT: 35.3 % — ABNORMAL LOW (ref 36.0–46.0)
Hemoglobin: 10.8 g/dL — ABNORMAL LOW (ref 12.0–15.0)
Immature Granulocytes: 0 %
Lymphocytes Relative: 27 %
Lymphs Abs: 1.2 10*3/uL (ref 0.7–4.0)
MCH: 26.8 pg (ref 26.0–34.0)
MCHC: 30.6 g/dL (ref 30.0–36.0)
MCV: 87.6 fL (ref 80.0–100.0)
Monocytes Absolute: 0.5 10*3/uL (ref 0.1–1.0)
Monocytes Relative: 10 %
Neutro Abs: 2.6 10*3/uL (ref 1.7–7.7)
Neutrophils Relative %: 57 %
Platelets: 353 10*3/uL (ref 150–400)
RBC: 4.03 MIL/uL (ref 3.87–5.11)
RDW: 25.7 % — ABNORMAL HIGH (ref 11.5–15.5)
WBC: 4.6 10*3/uL (ref 4.0–10.5)
nRBC: 0 % (ref 0.0–0.2)

## 2018-11-15 MED ORDER — SODIUM CHLORIDE 0.9 % IV SOLN
510.0000 mg | Freq: Once | INTRAVENOUS | Status: AC
  Administered 2018-11-15: 12:00:00 510 mg via INTRAVENOUS
  Filled 2018-11-15: qty 17

## 2018-11-15 MED ORDER — SODIUM CHLORIDE 0.9 % IV SOLN
Freq: Once | INTRAVENOUS | Status: AC
  Administered 2018-11-15: 12:00:00 via INTRAVENOUS
  Filled 2018-11-15: qty 250

## 2018-12-04 DIAGNOSIS — Z90710 Acquired absence of both cervix and uterus: Secondary | ICD-10-CM | POA: Diagnosis not present

## 2018-12-04 DIAGNOSIS — Z681 Body mass index (BMI) 19 or less, adult: Secondary | ICD-10-CM | POA: Diagnosis not present

## 2018-12-04 DIAGNOSIS — M1990 Unspecified osteoarthritis, unspecified site: Secondary | ICD-10-CM | POA: Diagnosis not present

## 2018-12-04 DIAGNOSIS — M810 Age-related osteoporosis without current pathological fracture: Secondary | ICD-10-CM | POA: Diagnosis not present

## 2018-12-04 DIAGNOSIS — I509 Heart failure, unspecified: Secondary | ICD-10-CM | POA: Diagnosis not present

## 2018-12-04 DIAGNOSIS — M069 Rheumatoid arthritis, unspecified: Secondary | ICD-10-CM | POA: Diagnosis not present

## 2018-12-04 DIAGNOSIS — E039 Hypothyroidism, unspecified: Secondary | ICD-10-CM | POA: Diagnosis not present

## 2018-12-04 DIAGNOSIS — F329 Major depressive disorder, single episode, unspecified: Secondary | ICD-10-CM | POA: Diagnosis not present

## 2018-12-04 DIAGNOSIS — F419 Anxiety disorder, unspecified: Secondary | ICD-10-CM | POA: Diagnosis not present

## 2018-12-04 DIAGNOSIS — F17210 Nicotine dependence, cigarettes, uncomplicated: Secondary | ICD-10-CM | POA: Diagnosis not present

## 2018-12-04 DIAGNOSIS — H548 Legal blindness, as defined in USA: Secondary | ICD-10-CM | POA: Diagnosis not present

## 2018-12-04 DIAGNOSIS — I110 Hypertensive heart disease with heart failure: Secondary | ICD-10-CM | POA: Diagnosis not present

## 2018-12-04 DIAGNOSIS — K219 Gastro-esophageal reflux disease without esophagitis: Secondary | ICD-10-CM | POA: Diagnosis not present

## 2018-12-04 DIAGNOSIS — D649 Anemia, unspecified: Secondary | ICD-10-CM | POA: Diagnosis not present

## 2018-12-05 ENCOUNTER — Other Ambulatory Visit: Payer: Self-pay

## 2018-12-06 ENCOUNTER — Telehealth: Payer: Self-pay

## 2018-12-06 NOTE — Telephone Encounter (Signed)
Gave verbal to social worker Evonnie Dawes [phone]   for community care help

## 2018-12-26 ENCOUNTER — Telehealth: Payer: Self-pay

## 2018-12-26 NOTE — Telephone Encounter (Signed)
Dawn Carpenter with [facility] following up on orders that she stated she faxed to the office back in May, Informed her that I do not see that we received the fax. She stated that she will refax it today and follow up tomorrow to see if it was received.

## 2019-01-02 ENCOUNTER — Ambulatory Visit: Payer: Self-pay | Admitting: Nurse Practitioner

## 2019-01-10 ENCOUNTER — Other Ambulatory Visit: Payer: Self-pay

## 2019-01-10 ENCOUNTER — Inpatient Hospital Stay: Payer: PPO | Attending: Internal Medicine

## 2019-01-10 ENCOUNTER — Inpatient Hospital Stay: Payer: PPO

## 2019-01-10 ENCOUNTER — Ambulatory Visit: Payer: PPO | Admitting: Internal Medicine

## 2019-01-10 DIAGNOSIS — D509 Iron deficiency anemia, unspecified: Secondary | ICD-10-CM | POA: Diagnosis not present

## 2019-01-10 DIAGNOSIS — I10 Essential (primary) hypertension: Secondary | ICD-10-CM

## 2019-01-10 LAB — CBC WITH DIFFERENTIAL/PLATELET
Abs Immature Granulocytes: 0.03 10*3/uL (ref 0.00–0.07)
Basophils Absolute: 0 10*3/uL (ref 0.0–0.1)
Basophils Relative: 0 %
Eosinophils Absolute: 0.1 10*3/uL (ref 0.0–0.5)
Eosinophils Relative: 1 %
HCT: 40 % (ref 36.0–46.0)
Hemoglobin: 13.2 g/dL (ref 12.0–15.0)
Immature Granulocytes: 1 %
Lymphocytes Relative: 13 %
Lymphs Abs: 0.8 10*3/uL (ref 0.7–4.0)
MCH: 28.9 pg (ref 26.0–34.0)
MCHC: 33 g/dL (ref 30.0–36.0)
MCV: 87.7 fL (ref 80.0–100.0)
Monocytes Absolute: 0.7 10*3/uL (ref 0.1–1.0)
Monocytes Relative: 12 %
Neutro Abs: 4.2 10*3/uL (ref 1.7–7.7)
Neutrophils Relative %: 73 %
Platelets: 418 10*3/uL — ABNORMAL HIGH (ref 150–400)
RBC: 4.56 MIL/uL (ref 3.87–5.11)
RDW: 14.6 % (ref 11.5–15.5)
WBC: 5.8 10*3/uL (ref 4.0–10.5)
nRBC: 0 % (ref 0.0–0.2)

## 2019-01-10 LAB — BASIC METABOLIC PANEL
Anion gap: 15 (ref 5–15)
BUN: 10 mg/dL (ref 8–23)
CO2: 26 mmol/L (ref 22–32)
Calcium: 8.9 mg/dL (ref 8.9–10.3)
Chloride: 95 mmol/L — ABNORMAL LOW (ref 98–111)
Creatinine, Ser: 0.58 mg/dL (ref 0.44–1.00)
GFR calc Af Amer: >60 mL/min (ref >60)
GFR calc non Af Amer: >60 mL/min (ref >60)
Glucose, Bld: 127 mg/dL — ABNORMAL HIGH (ref 70–99)
Potassium: 3.4 mmol/L — ABNORMAL LOW (ref 3.5–5.1)
Sodium: 136 mmol/L (ref 135–145)

## 2019-01-10 MED ORDER — LOSARTAN POTASSIUM 25 MG PO TABS: 12.5000 mg | ORAL_TABLET | Freq: Every day | ORAL | 5 refills | Status: DC

## 2019-01-11 ENCOUNTER — Inpatient Hospital Stay: Payer: PPO | Admitting: Internal Medicine

## 2019-01-11 ENCOUNTER — Telehealth: Payer: Self-pay | Admitting: *Deleted

## 2019-01-11 NOTE — Telephone Encounter (Signed)
Another vm left for patient/patient's daughter Dawn Carpenter. Requested call back to initiate virtual visit for patient.

## 2019-01-11 NOTE — Telephone Encounter (Signed)
Attempted to reach patient for her virtual visit. Left vm x 2- requesting a call back to initiate virtual visit.

## 2019-01-12 ENCOUNTER — Other Ambulatory Visit: Payer: Self-pay | Admitting: *Deleted

## 2019-01-12 ENCOUNTER — Inpatient Hospital Stay (HOSPITAL_BASED_OUTPATIENT_CLINIC_OR_DEPARTMENT_OTHER): Payer: PPO | Admitting: Internal Medicine

## 2019-01-12 ENCOUNTER — Encounter: Payer: Self-pay | Admitting: Internal Medicine

## 2019-01-12 ENCOUNTER — Other Ambulatory Visit: Payer: Self-pay

## 2019-01-12 DIAGNOSIS — D509 Iron deficiency anemia, unspecified: Secondary | ICD-10-CM

## 2019-01-12 NOTE — Progress Notes (Signed)
I connected with Dawn Borowski V. on 01/12/19 at 11:00 AM EDT by video enabled telemedicine visit and verified that I am speaking with the correct person using two identifiers.  I discussed the limitations, risks, security and privacy concerns of performing an evaluation and management service by telemedicine and the availability of in-person appointments. I also discussed with the patient that there may be a patient responsible charge related to this service. The patient expressed understanding and agreed to proceed.    Other persons participating in the visit and their role in the encounter: RN/medicla reconcilation  Patient's location: Home Provider's location: Home  Oncology History   No history exists.     Chief Complaint: Iron deficient anemia   History of present illness:Dawn Carpenter 73 y.o.  female with history of severe iron deficient anemia of unclear etiology is here for follow-up.  Patient denies any worsening shortness of breath or cough he denies any nausea vomiting.  No blood in stools or black or stools.  She continues been on iron.  She has not followed up with GI yet.  Observation/objective: Hemoglobin 13.  Assessment and plan: Iron deficiency anemia, unspecified #  Severe symptomatic iron deficiency anemia hemoglobin at presentation 2.6- [in March 2020]; hemoglobin today is 13.  Clinically stable.  Would not recommend any IV iron.  #Etiology of iron deficiency-unclear at this time-march CT scan no acute process. Recommend follow up with GI. Defer to PCP for referrals/patient seen Dr. Vira Agar in the past.  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  Stable.   #Disposition:  #Follow-up with MD- 4 months/CBC-BMP/iron studies/ferritin- Feraheme- Dr.B    Follow-up instructions:  I discussed the assessment and treatment plan with the patient.  The patient was provided an opportunity to  ask questions and all were answered.  The patient agreed with the plan and demonstrated understanding of instructions.  The patient was advised to call back or seek an in person evaluation if the symptoms worsen or if the condition fails to improve as anticipated.  Dr. Charlaine Dalton [facility] at [facility] 01/12/2019 11:11 AM

## 2019-01-12 NOTE — Assessment & Plan Note (Addendum)
#    Severe symptomatic iron deficiency anemia hemoglobin at presentation 2.6- [in March 2020]; hemoglobin today is 13.  Clinically stable.  Would not recommend any IV iron.  #Etiology of iron deficiency-unclear at this time-march CT scan no acute process. Recommend follow up with GI. Defer to PCP for referrals/patient seen Dr. Vira Agar in the past.  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  Stable.   #Disposition:  #Follow-up with MD- 4 months/CBC-BMP/iron studies/ferritin- Feraheme- Dr.B

## 2019-01-25 ENCOUNTER — Ambulatory Visit: Payer: PPO | Admitting: Internal Medicine

## 2019-01-30 ENCOUNTER — Other Ambulatory Visit: Payer: Self-pay

## 2019-01-30 DIAGNOSIS — E039 Hypothyroidism, unspecified: Secondary | ICD-10-CM

## 2019-01-30 MED ORDER — LEVOTHYROXINE SODIUM 50 MCG PO TABS: 50.0000 ug | ORAL_TABLET | Freq: Every day | ORAL | 3 refills | Status: DC

## 2019-01-31 ENCOUNTER — Other Ambulatory Visit: Payer: Self-pay

## 2019-01-31 DIAGNOSIS — E039 Hypothyroidism, unspecified: Secondary | ICD-10-CM

## 2019-01-31 MED ORDER — LEVOTHYROXINE SODIUM 50 MCG PO TABS: 50.0000 ug | ORAL_TABLET | Freq: Every day | ORAL | 3 refills | Status: AC

## 2019-02-07 ENCOUNTER — Other Ambulatory Visit: Payer: Self-pay

## 2019-02-07 ENCOUNTER — Ambulatory Visit (INDEPENDENT_AMBULATORY_CARE_PROVIDER_SITE_OTHER): Payer: PPO | Admitting: Nurse Practitioner

## 2019-02-07 ENCOUNTER — Encounter: Payer: Self-pay | Admitting: Nurse Practitioner

## 2019-02-07 VITALS — BP 136/89 | HR 119 | Resp 16 | Ht 64.0 in | Wt 90.7 lb

## 2019-02-07 DIAGNOSIS — D509 Iron deficiency anemia, unspecified: Secondary | ICD-10-CM

## 2019-02-07 DIAGNOSIS — W19XXXA Unspecified fall, initial encounter: Secondary | ICD-10-CM | POA: Insufficient documentation

## 2019-02-07 DIAGNOSIS — F321 Major depressive disorder, single episode, moderate: Secondary | ICD-10-CM

## 2019-02-07 DIAGNOSIS — F411 Generalized anxiety disorder: Secondary | ICD-10-CM

## 2019-02-07 DIAGNOSIS — I10 Essential (primary) hypertension: Secondary | ICD-10-CM

## 2019-02-07 DIAGNOSIS — R062 Wheezing: Secondary | ICD-10-CM | POA: Insufficient documentation

## 2019-02-07 DIAGNOSIS — K219 Gastro-esophageal reflux disease without esophagitis: Secondary | ICD-10-CM

## 2019-02-07 DIAGNOSIS — M25511 Pain in right shoulder: Secondary | ICD-10-CM | POA: Diagnosis not present

## 2019-02-07 MED ORDER — MIRTAZAPINE 30 MG PO TABS: 30.0000 mg | ORAL_TABLET | Freq: Every day | ORAL | 5 refills | Status: AC

## 2019-02-07 MED ORDER — ESCITALOPRAM OXALATE 10 MG PO TABS: 10.0000 mg | ORAL_TABLET | Freq: Every day | ORAL | 5 refills | Status: AC

## 2019-02-07 MED ORDER — OMEPRAZOLE 40 MG PO CPDR: 40.0000 mg | DELAYED_RELEASE_CAPSULE | Freq: Every day | ORAL | 5 refills | Status: AC

## 2019-02-07 MED ORDER — ALPRAZOLAM 0.25 MG PO TABS: 0.1250 mg | ORAL_TABLET | Freq: Two times a day (BID) | ORAL | 3 refills | Status: DC | PRN

## 2019-02-07 MED ORDER — LOSARTAN POTASSIUM 25 MG PO TABS: 25.0000 mg | ORAL_TABLET | Freq: Every day | ORAL | 5 refills | Status: AC

## 2019-02-07 MED ORDER — HEMATINIC/FOLIC ACID 324-1 MG PO TABS: 1.0000 | ORAL_TABLET | Freq: Every day | ORAL | 5 refills | Status: AC

## 2019-02-07 NOTE — Progress Notes (Signed)
[facility] [place], [place] [address]  Internal MEDICINE  Office Visit Note  Patient Name: Dawn Carpenter  [id number]  [id number]  Date of Service: 02/07/2019  Chief Complaint  Patient presents with  . Hypertension  . Gastroesophageal Reflux  . Congestive Heart Failure  . Anxiety  . Depression  . Anemia  . Fall    few falls  . Arm Pain    pain stays near shoulder area    The patient is here for routine follow up. She states that she fell on Saturday. She rolloed off the couch onto her right arm. Hurts quit a bit. States that she has taken some aleve to help the pain, which has helped. She states that she has not iced it. Was not seen in ER and did not have x-rays. She states that she feels ok otherwise. She does need to have refills for all her routine medications.  Patient is here with her daughter. Her daughter states that she is unable to take good care of her mother. She has bery demanding job. Travels with her son every other weekend. Daughter states that the patient really is not doing anything to help herself. She does not eat, is not active at all. Did have social work coming out and physical therapist for some time. They have discharged and released the patient.       Current Medication: Outpatient Encounter Medications as of 02/07/2019  Medication Sig  . ALPRAZolam (XANAX) 0.25 MG tablet Take 0.5 tablets (0.125 mg total) by mouth 2 (two) times daily as needed.  Marland Kitchen escitalopram (LEXAPRO) 10 MG tablet Take 1 tablet (10 mg total) by mouth daily.  . Ferrous Fumarate-Folic Acid (HEMATINIC/FOLIC ACID) 967-8 MG TABS Take 1 tablet by mouth daily.  Marland Kitchen levothyroxine (SYNTHROID) 50 MCG tablet Take 1 tablet (50 mcg total) by mouth daily before breakfast.  . losartan (COZAAR) 25 MG tablet Take 1 tablet (25 mg total) by mouth daily.  . mirtazapine (REMERON) 30 MG tablet Take 1 tablet (30 mg total) by mouth at bedtime.  Marland Kitchen omeprazole (PRILOSEC) 40 MG  capsule Take 1 capsule (40 mg total) by mouth daily.  . [DISCONTINUED] ALPRAZolam (XANAX) 0.25 MG tablet Take 0.5 tablets (0.125 mg total) by mouth 2 (two) times daily as needed.  . [DISCONTINUED] escitalopram (LEXAPRO) 10 MG tablet Take 1 tablet (10 mg total) by mouth daily.  . [DISCONTINUED] Ferrous Fumarate-Folic Acid (HEMATINIC/FOLIC ACID) 938-1 MG TABS Take 1 tablet by mouth daily.  . [DISCONTINUED] losartan (COZAAR) 25 MG tablet Take 0.5 tablets (12.5 mg total) by mouth daily. (Patient taking differently: Take 25 mg by mouth daily. )  . [DISCONTINUED] mirtazapine (REMERON) 30 MG tablet Take 1 tablet (30 mg total) by mouth at bedtime.  . [DISCONTINUED] omeprazole (PRILOSEC) 40 MG capsule Take 1 capsule (40 mg total) by mouth daily.  . [DISCONTINUED] Biotin 1 MG CAPS Take 1 tablet by mouth daily.  . [DISCONTINUED] Multiple Vitamin (MULTIVITAMIN) capsule Take 1 capsule by mouth daily.   No facility-administered encounter medications on file as of 02/07/2019.     Surgical History: Past Surgical History:  Procedure Laterality Date  . ABDOMINAL HYSTERECTOMY    . COLONOSCOPY    . COLONOSCOPY WITH PROPOFOL N/A 05/15/2015   Procedure: COLONOSCOPY WITH PROPOFOL;  Surgeon: Manya Silvas, MD;  Location: [facility] ENDOSCOPY;  Service: Endoscopy;  Laterality: N/A;  . ESOPHAGOGASTRODUODENOSCOPY (EGD) WITH PROPOFOL N/A 05/15/2015   Procedure: ESOPHAGOGASTRODUODENOSCOPY (EGD) WITH PROPOFOL;  Surgeon: Gavin Pound  Vira Agar, MD;  Location: [facility] ENDOSCOPY;  Service: Endoscopy;  Laterality: N/A;  . HAND SURGERY     for arthritis  . UPPER GI ENDOSCOPY      Medical History: Past Medical History:  Diagnosis Date  . Anemia   . Anxiety   . Arthritis    rheumatoid  . Depression   . GERD (gastroesophageal reflux disease)   . Hemorrhoids   . Hypertension   . Osteoporosis   . Thyroid disease    hypothyoridism    Family History: Family History  Problem Relation Age of Onset  . Colon cancer Father   .  Heart disease Father   . Hypertension Mother   . Breast cancer Neg Hx     Social History   Socioeconomic History  . Marital status: Single    Spouse name: Not on file  . Number of children: Not on file  . Years of education: Not on file  . Highest education level: Not on file  Occupational History  . Not on file  Social Needs  . Financial resource strain: Not very hard  . Food insecurity    Worry: Patient refused    Inability: Patient refused  . Transportation needs    Medical: Patient refused    Non-medical: Patient refused  Tobacco Use  . Smoking status: Current Every Day Smoker    Packs/day: 0.50    Years: 40.00    Pack years: 20.00    Types: Cigarettes  . Smokeless tobacco: Never Used  . Tobacco comment: Hx of 1 PPD from 73 years old until 01/2016  Substance and Sexual Activity  . Alcohol use: Yes    Frequency: Never  . Drug use: No  . Sexual activity: Not Currently  Lifestyle  . Physical activity    Days per week: Patient refused    Minutes per session: Patient refused  . Stress: Only a little  Relationships  . Social Herbalist on phone: Patient refused    Gets together: Patient refused    Attends religious service: Patient refused    Active member of club or organization: Patient refused    Attends meetings of clubs or organizations: Patient refused    Relationship status: Patient refused  . Intimate partner violence    Fear of current or ex partner: Patient refused    Emotionally abused: Patient refused    Physically abused: Patient refused    Forced sexual activity: Patient refused  Other Topics Concern  . Not on file  Social History Narrative  . Not on file      Review of Systems  Constitutional: Positive for fatigue. Negative for chills and unexpected weight change.  HENT: Negative for congestion, postnasal drip, rhinorrhea, sneezing and sore throat.   Eyes: Negative for redness.  Respiratory: Negative for cough, chest tightness,  shortness of breath and wheezing.   Cardiovascular: Negative for chest pain and palpitations.  Gastrointestinal: Negative for abdominal pain, constipation, diarrhea, nausea and vomiting.  Endocrine: Negative for cold intolerance, heat intolerance, polydipsia and polyuria.  Musculoskeletal: Positive for arthralgias and joint swelling. Negative for back pain and neck pain.       Right shoulder and right upper arm.   Skin: Negative for rash.  Allergic/Immunologic: Negative for environmental allergies.  Neurological: Positive for weakness. Negative for dizziness, tremors, numbness and headaches.  Hematological: Negative for adenopathy. Does not bruise/bleed easily.  Psychiatric/Behavioral: Positive for dysphoric mood. Negative for behavioral problems (Depression), sleep disturbance and suicidal ideas. The  patient is nervous/anxious.        Well managed depression and anxiety with current medications.     Today's Vitals   02/07/19 1415  BP: 136/89  Pulse: (!) 119  Resp: 16  SpO2: 99%  Weight: 90 lb 11.2 oz (41.1 kg)  Height: 5\' 4"  (1.626 m)   Body mass index is 15.57 kg/m.  Physical Exam Vitals signs and nursing note reviewed.  Constitutional:      General: She is not in acute distress.    Appearance: Normal appearance. She is well-developed. She is not diaphoretic.  HENT:     Head: Normocephalic and atraumatic.     Nose: Nose normal.     Mouth/Throat:     Pharynx: No oropharyngeal exudate.  Eyes:     Pupils: Pupils are equal, round, and reactive to light.  Neck:     Musculoskeletal: Normal range of motion and neck supple.     Thyroid: No thyromegaly.     Vascular: No carotid bruit or JVD.     Trachea: No tracheal deviation.  Cardiovascular:     Rate and Rhythm: Regular rhythm. Tachycardia present.     Heart sounds: Normal heart sounds. No murmur. No friction rub. No gallop.   Pulmonary:     Effort: Pulmonary effort is normal. No respiratory distress.     Breath sounds:  Wheezing and rhonchi present. No rales.  Chest:     Chest wall: No tenderness.     Breasts:        Right: No inverted nipple, mass, nipple discharge, skin change or tenderness.        Left: No inverted nipple, mass, nipple discharge, skin change or tenderness.  Abdominal:     General: Bowel sounds are normal.     Palpations: Abdomen is soft.     Tenderness: There is no abdominal tenderness.  Musculoskeletal: Normal range of motion.     Comments: Right shoulder being protected by placement. Looks like may be dislocated. Can lift right arm, but not even to ninety degrees. Is swollen and tender to palpate.   Lymphadenopathy:     Cervical: No cervical adenopathy.  Skin:    General: Skin is warm and dry.     Capillary Refill: Capillary refill takes less than 2 seconds.  Neurological:     Mental Status: She is alert and oriented to person, place, and time.     Cranial Nerves: No cranial nerve deficit.  Psychiatric:        Behavior: Behavior normal.        Thought Content: Thought content normal.        Judgment: Judgment normal.   Assessment/Plan:  1. Acute pain of right shoulder Will get x-ray of right shoulder for further evaluation. Will refer to orthopedics as needed. - DG Shoulder Right; Future  2. Fall, initial encounter Fall from couch at home. Hurt right shoulder. Will get x-ray and refer to orthopedics provider as indicated.  - DG Shoulder Right; Future  3. Wheezing Will get chest x-ray. Antibiotic treatment as indicated.  - DG Chest 2 View; Future  4. Generalized anxiety disorder Continue lexapro 10mg  daily. May take 0.125mg  up to twice daily if needed for acute anxiety.  - ALPRAZolam (XANAX) 0.25 MG tablet; Take 0.5 tablets (0.125 mg total) by mouth 2 (two) times daily as needed.  Dispense: 30 tablet; Refill: 3 - escitalopram (LEXAPRO) 10 MG tablet; Take 1 tablet (10 mg total) by mouth daily.  Dispense: 30 tablet; Refill:  5  5. Essential hypertension Stable.  -  losartan (COZAAR) 25 MG tablet; Take 1 tablet (25 mg total) by mouth daily.  Dispense: 30 tablet; Refill: 5  6. Gastroesophageal reflux disease without esophagitis - omeprazole (PRILOSEC) 40 MG capsule; Take 1 capsule (40 mg total) by mouth daily.  Dispense: 30 capsule; Refill: 5  7. Iron deficiency anemia, unspecified iron deficiency anemia type - Ferrous Fumarate-Folic Acid (HEMATINIC/FOLIC ACID) 101-7 MG TABS; Take 1 tablet by mouth daily.  Dispense: 30 tablet; Refill: 5  8. Moderate major depression (HCC) - mirtazapine (REMERON) 30 MG tablet; Take 1 tablet (30 mg total) by mouth at bedtime.  Dispense: 30 tablet; Refill: 5  General Counseling: yachet mattson understanding of the findings of todays visit and agrees with plan of treatment. I have discussed any further diagnostic evaluation that may be needed or ordered today. We also reviewed her medications today. she has been encouraged to call the office with any questions or concerns that should arise related to todays visit.  This patient was seen by Leretha Pol FNP Collaboration with Dr Lavera Guise as a part of collaborative care agreement  Orders Placed This Encounter  Procedures  . DG Shoulder Right  . DG Chest 2 View    Meds ordered this encounter  Medications  . ALPRAZolam (XANAX) 0.25 MG tablet    Sig: Take 0.5 tablets (0.125 mg total) by mouth 2 (two) times daily as needed.    Dispense:  30 tablet    Refill:  3    Order Specific Question:   Supervising Provider    Answer:   Lavera Guise [[id number]]  . escitalopram (LEXAPRO) 10 MG tablet    Sig: Take 1 tablet (10 mg total) by mouth daily.    Dispense:  30 tablet    Refill:  5    Order Specific Question:   Supervising Provider    Answer:   Lavera Guise [[id number]]  . Ferrous Fumarate-Folic Acid (HEMATINIC/FOLIC ACID) 778-2 MG TABS    Sig: Take 1 tablet by mouth daily.    Dispense:  30 tablet    Refill:  5    Order Specific Question:   Supervising Provider     Answer:   Lavera Guise [[id number]]  . losartan (COZAAR) 25 MG tablet    Sig: Take 1 tablet (25 mg total) by mouth daily.    Dispense:  30 tablet    Refill:  5    Order Specific Question:   Supervising Provider    Answer:   Lavera Guise [[id number]]  . mirtazapine (REMERON) 30 MG tablet    Sig: Take 1 tablet (30 mg total) by mouth at bedtime.    Dispense:  30 tablet    Refill:  5    Order Specific Question:   Supervising Provider    Answer:   Lavera Guise [[id number]]  . omeprazole (PRILOSEC) 40 MG capsule    Sig: Take 1 capsule (40 mg total) by mouth daily.    Dispense:  30 capsule    Refill:  5    Order Specific Question:   Supervising Provider    Answer:   Lavera Guise [[id number]]    Time spent: 99 Minutes      Dr Lavera Guise Internal medicine

## 2019-02-08 ENCOUNTER — Ambulatory Visit
Admission: RE | Admit: 2019-02-08 | Discharge: 2019-02-08 | Disposition: A | Discharge status: Home / Self Care | Payer: PPO | Source: Ambulatory Visit | Attending: Nurse Practitioner | Admitting: Nurse Practitioner

## 2019-02-08 ENCOUNTER — Other Ambulatory Visit: Payer: Self-pay

## 2019-02-08 DIAGNOSIS — R062 Wheezing: Secondary | ICD-10-CM

## 2019-02-08 DIAGNOSIS — J449 Chronic obstructive pulmonary disease, unspecified: Secondary | ICD-10-CM | POA: Insufficient documentation

## 2019-02-08 DIAGNOSIS — W19XXXA Unspecified fall, initial encounter: Secondary | ICD-10-CM | POA: Insufficient documentation

## 2019-02-08 DIAGNOSIS — F17210 Nicotine dependence, cigarettes, uncomplicated: Secondary | ICD-10-CM | POA: Diagnosis not present

## 2019-02-08 DIAGNOSIS — S43014A Anterior dislocation of right humerus, initial encounter: Secondary | ICD-10-CM | POA: Insufficient documentation

## 2019-02-08 DIAGNOSIS — S43034A Inferior dislocation of right humerus, initial encounter: Secondary | ICD-10-CM | POA: Diagnosis not present

## 2019-02-08 DIAGNOSIS — M25511 Pain in right shoulder: Secondary | ICD-10-CM | POA: Insufficient documentation

## 2019-02-09 ENCOUNTER — Telehealth: Payer: Self-pay

## 2019-02-09 NOTE — Telephone Encounter (Signed)
-----   Message from Ronnell Freshwater, NP sent at 02/09/2019  9:24 AM EDT ----- Please let the patient/daughter know that chest x-ray showing no acute problems, but does show evidence of COPD. She does have dislocated right shoulder and should be referred, urgently, to orthopedics for further evaluation and treatment.

## 2019-02-09 NOTE — Telephone Encounter (Signed)
lmom to call back and gave beth for ortho referral

## 2019-02-09 NOTE — Telephone Encounter (Signed)
Pt daughter advised xray result see other note

## 2019-02-21 ENCOUNTER — Other Ambulatory Visit
Admission: RE | Admit: 2019-02-21 | Discharge: 2019-02-21 | Disposition: A | Discharge status: Home / Self Care | Payer: PPO | Source: Ambulatory Visit | Attending: Orthopedic Surgery | Admitting: Orthopedic Surgery

## 2019-02-21 ENCOUNTER — Other Ambulatory Visit: Payer: Self-pay

## 2019-02-21 DIAGNOSIS — S43004A Unspecified dislocation of right shoulder joint, initial encounter: Secondary | ICD-10-CM | POA: Diagnosis not present

## 2019-02-21 DIAGNOSIS — Z20828 Contact with and (suspected) exposure to other viral communicable diseases: Secondary | ICD-10-CM | POA: Insufficient documentation

## 2019-02-21 DIAGNOSIS — Z01812 Encounter for preprocedural laboratory examination: Secondary | ICD-10-CM | POA: Diagnosis not present

## 2019-02-22 ENCOUNTER — Encounter
Admission: RE | Admit: 2019-02-22 | Discharge: 2019-02-22 | Disposition: A | Discharge status: Home / Self Care | Payer: PPO | Source: Ambulatory Visit | Attending: Orthopedic Surgery | Admitting: Orthopedic Surgery

## 2019-02-22 ENCOUNTER — Other Ambulatory Visit: Payer: Self-pay

## 2019-02-22 DIAGNOSIS — Z01818 Encounter for other preprocedural examination: Secondary | ICD-10-CM | POA: Diagnosis not present

## 2019-02-22 DIAGNOSIS — F17200 Nicotine dependence, unspecified, uncomplicated: Secondary | ICD-10-CM | POA: Insufficient documentation

## 2019-02-22 DIAGNOSIS — R9431 Abnormal electrocardiogram [ECG] [EKG]: Secondary | ICD-10-CM | POA: Diagnosis not present

## 2019-02-22 DIAGNOSIS — I4891 Unspecified atrial fibrillation: Secondary | ICD-10-CM | POA: Diagnosis not present

## 2019-02-22 DIAGNOSIS — I10 Essential (primary) hypertension: Secondary | ICD-10-CM | POA: Diagnosis not present

## 2019-02-22 DIAGNOSIS — I5022 Chronic systolic (congestive) heart failure: Secondary | ICD-10-CM | POA: Diagnosis not present

## 2019-02-22 HISTORY — DX: Hypothyroidism, unspecified: E03.9

## 2019-02-22 HISTORY — DX: Pain in unspecified shoulder: M25.519

## 2019-02-22 HISTORY — DX: Heart failure, unspecified: I50.9

## 2019-02-22 LAB — SARS CORONAVIRUS 2 (TAT 6-24 HRS): SARS Coronavirus 2: NEGATIVE

## 2019-02-22 NOTE — Pre-Procedure Instructions (Addendum)
Abnormal EKG called Dr Randa Lynn.instructed to send patient to the ER.Patient had already left pre-admit testing.  Attempting to notify the patients daughter, Arlice Colt.  Dr Serita Grit office notified of above mentioned. Called to ER to notify the patient was instructed to go to the ER. Dr Serita Grit office called the patient and directed to patient to Dr Saralyn Pilar' office.

## 2019-02-22 NOTE — Patient Instructions (Addendum)
Your procedure is scheduled on: 02/23/2019 @11 :30 AM Report to Same Day Surgery 2nd floor medical mall [facility] Entrance-take elevator on left to 2nd floor.  Check in with surgery information desk.)   Remember: Instructions that are not followed completely may result in serious medical risk, up to and including death, or upon the discretion of your surgeon and anesthesiologist your surgery may need to be rescheduled.    _x___ 1. Do not eat food after midnight the night before your procedure. You may drink clear liquids up to 2 hours before you are scheduled to arrive at the hospital for your procedure.  Do not drink clear liquids within 2 hours of your scheduled arrival to the hospital.  Clear liquids include  --Water or Apple juice without pulp  --Clear carbohydrate beverage such as ClearFast or Gatorade  --Black Coffee or Clear Tea (No milk, no creamers, do not add anything to                  the coffee or Tea Type 1 and type 2 diabetics should only drink water.   ____Ensure clear carbohydrate drink on the way to the hospital for bariatric patients  ____Ensure clear carbohydrate drink 3 hours before surgery.   No gum chewing or hard candies.     __x__ 2. No Alcohol for 24 hours before or after surgery.   __x__3. No Smoking or e-cigarettes for 24 prior to surgery.  Do not use any chewable tobacco products for at least 6 hour prior to surgery   ____  4. Bring all medications with you on the day of surgery if instructed.    __x__ 5. Notify your doctor if there is any change in your medical condition     (cold, fever, infections).    x___6. On the morning of surgery brush your teeth with toothpaste and water.  You may rinse your mouth with mouth wash if you wish.  Do not swallow any toothpaste or mouthwash.   Do not wear jewelry, make-up, hairpins, clips or nail polish.  Do not wear lotions, powders, or perfumes. You may wear deodorant.  Do not shave 48 hours prior to surgery.  Men may shave face and neck.  Do not bring valuables to the hospital.    [facility] is not responsible for any belongings or valuables.               Contacts, dentures or bridgework may not be worn into surgery.  Leave your suitcase in the car. After surgery it may be brought to your room.  For patients admitted to the hospital, discharge time is determined by your                       treatment team.  _  Patients discharged the day of surgery will not be allowed to drive home.  You will need someone to drive you home and stay with you the night of your procedure.    Please read over the following fact sheets that you were given:   [facility] Preparing for Surgery and or MRSA Information   _x___ Take anti-hypertensive listed below, cardiac, seizure, asthma,     anti-reflux and psychiatric medicines. These include:  1. ALPRAZolam (XANAX) 0.25 MG tablet  2.escitalopram (LEXAPRO) 10 MG tablet  3.levothyroxine (SYNTHROID) 50 MCG tablet  4.omeprazole (PRILOSEC) 40 MG capsule  5.traMADol (ULTRAM) 50 MG tablet IF needed  6.  ____Fleets enema or Magnesium Citrate as directed.  _x___ Use CHG Soap or sage wipes as directed on instruction sheet   ____ Use inhalers on the day of surgery and bring to hospital day of surgery  ____ Stop Metformin and Janumet 2 days prior to surgery.    ____ Take 1/2 of usual insulin dose the night before surgery and none on the morning     surgery.   _x___ Follow recommendations from Cardiologist, Pulmonologist or PCP regarding          stopping Aspirin, Coumadin, Plavix ,Eliquis, Effient, or Pradaxa, and Pletal.  X____Stop Anti-inflammatories such as Advil, Aleve, Ibuprofen, Motrin, Naproxen, Naprosyn, Goodies powders or aspirin products. OK to take Tylenol and                          Celebrex.   _x___ Stop supplements until after surgery.  But may continue Vitamin D, Vitamin B,       and multivitamin.   ____ Bring C-Pap to the hospital.

## 2019-02-23 ENCOUNTER — Encounter: Admission: RE | Payer: Self-pay | Source: Home / Self Care

## 2019-02-23 ENCOUNTER — Ambulatory Visit: Admission: RE | Admit: 2019-02-23 | Payer: PPO | Source: Home / Self Care | Admitting: Orthopedic Surgery

## 2019-02-23 SURGERY — CLOSED REDUCTION, FRACTURE, HUMERUS, SHAFT
Anesthesia: Choice | Laterality: Right

## 2019-02-23 SURGERY — CLOSED REDUCTION, SHOULDER
Anesthesia: Choice | Laterality: Right

## 2019-02-24 DIAGNOSIS — I4891 Unspecified atrial fibrillation: Secondary | ICD-10-CM | POA: Diagnosis not present

## 2019-02-24 DIAGNOSIS — I10 Essential (primary) hypertension: Secondary | ICD-10-CM | POA: Diagnosis not present

## 2019-02-24 DIAGNOSIS — Z01818 Encounter for other preprocedural examination: Secondary | ICD-10-CM | POA: Diagnosis not present

## 2019-02-24 DIAGNOSIS — I5022 Chronic systolic (congestive) heart failure: Secondary | ICD-10-CM | POA: Diagnosis not present

## 2019-03-01 NOTE — Pre-Procedure Instructions (Signed)
CARDIAC CLEARANCE FROM 02/24/19 ON CHART

## 2019-03-02 ENCOUNTER — Ambulatory Visit: Payer: PPO

## 2019-03-02 ENCOUNTER — Ambulatory Visit
Admission: RE | Admit: 2019-03-02 | Discharge: 2019-03-02 | Disposition: A | Discharge status: Home / Self Care | Payer: PPO | Attending: Orthopedic Surgery | Admitting: Orthopedic Surgery

## 2019-03-02 ENCOUNTER — Ambulatory Visit: Payer: PPO | Admitting: Anesthesiology

## 2019-03-02 ENCOUNTER — Encounter
Admission: RE | Disposition: A | Discharge status: Home / Self Care | Payer: Self-pay | Source: Home / Self Care | Attending: Orthopedic Surgery

## 2019-03-02 ENCOUNTER — Other Ambulatory Visit
Admission: RE | Admit: 2019-03-02 | Discharge: 2019-03-02 | Disposition: A | Discharge status: Home / Self Care | Payer: PPO | Source: Ambulatory Visit | Attending: Orthopedic Surgery | Admitting: Orthopedic Surgery

## 2019-03-02 ENCOUNTER — Other Ambulatory Visit: Payer: Self-pay

## 2019-03-02 DIAGNOSIS — I110 Hypertensive heart disease with heart failure: Secondary | ICD-10-CM | POA: Diagnosis not present

## 2019-03-02 DIAGNOSIS — S43014A Anterior dislocation of right humerus, initial encounter: Secondary | ICD-10-CM | POA: Diagnosis not present

## 2019-03-02 DIAGNOSIS — W08XXXA Fall from other furniture, initial encounter: Secondary | ICD-10-CM | POA: Insufficient documentation

## 2019-03-02 DIAGNOSIS — I5022 Chronic systolic (congestive) heart failure: Secondary | ICD-10-CM | POA: Diagnosis not present

## 2019-03-02 DIAGNOSIS — K219 Gastro-esophageal reflux disease without esophagitis: Secondary | ICD-10-CM | POA: Insufficient documentation

## 2019-03-02 DIAGNOSIS — F17200 Nicotine dependence, unspecified, uncomplicated: Secondary | ICD-10-CM | POA: Diagnosis not present

## 2019-03-02 DIAGNOSIS — M1990 Unspecified osteoarthritis, unspecified site: Secondary | ICD-10-CM | POA: Insufficient documentation

## 2019-03-02 DIAGNOSIS — S43034A Inferior dislocation of right humerus, initial encounter: Secondary | ICD-10-CM | POA: Diagnosis not present

## 2019-03-02 DIAGNOSIS — S43004A Unspecified dislocation of right shoulder joint, initial encounter: Secondary | ICD-10-CM | POA: Diagnosis not present

## 2019-03-02 DIAGNOSIS — Z9181 History of falling: Secondary | ICD-10-CM | POA: Insufficient documentation

## 2019-03-02 DIAGNOSIS — J449 Chronic obstructive pulmonary disease, unspecified: Secondary | ICD-10-CM | POA: Insufficient documentation

## 2019-03-02 DIAGNOSIS — I4891 Unspecified atrial fibrillation: Secondary | ICD-10-CM | POA: Diagnosis not present

## 2019-03-02 DIAGNOSIS — Z79899 Other long term (current) drug therapy: Secondary | ICD-10-CM | POA: Insufficient documentation

## 2019-03-02 DIAGNOSIS — F419 Anxiety disorder, unspecified: Secondary | ICD-10-CM | POA: Diagnosis not present

## 2019-03-02 DIAGNOSIS — Z20828 Contact with and (suspected) exposure to other viral communicable diseases: Secondary | ICD-10-CM | POA: Insufficient documentation

## 2019-03-02 DIAGNOSIS — G8918 Other acute postprocedural pain: Secondary | ICD-10-CM | POA: Diagnosis not present

## 2019-03-02 DIAGNOSIS — I5021 Acute systolic (congestive) heart failure: Secondary | ICD-10-CM | POA: Diagnosis not present

## 2019-03-02 DIAGNOSIS — F329 Major depressive disorder, single episode, unspecified: Secondary | ICD-10-CM | POA: Diagnosis not present

## 2019-03-02 DIAGNOSIS — Z79890 Hormone replacement therapy (postmenopausal): Secondary | ICD-10-CM | POA: Diagnosis not present

## 2019-03-02 DIAGNOSIS — E039 Hypothyroidism, unspecified: Secondary | ICD-10-CM | POA: Insufficient documentation

## 2019-03-02 DIAGNOSIS — S42309A Unspecified fracture of shaft of humerus, unspecified arm, initial encounter for closed fracture: Secondary | ICD-10-CM

## 2019-03-02 DIAGNOSIS — M25512 Pain in left shoulder: Secondary | ICD-10-CM | POA: Diagnosis not present

## 2019-03-02 DIAGNOSIS — S42291D Other displaced fracture of upper end of right humerus, subsequent encounter for fracture with routine healing: Secondary | ICD-10-CM | POA: Diagnosis not present

## 2019-03-02 HISTORY — PX: SHOULDER CLOSED REDUCTION: SHX1051

## 2019-03-02 LAB — SARS CORONAVIRUS 2 BY RT PCR (HOSPITAL ORDER, PERFORMED IN ~~LOC~~ HOSPITAL LAB): SARS Coronavirus 2: NEGATIVE

## 2019-03-02 SURGERY — CLOSED REDUCTION, SHOULDER
Anesthesia: General | Site: Shoulder | Laterality: Right

## 2019-03-02 MED ORDER — IPRATROPIUM-ALBUTEROL 0.5-2.5 (3) MG/3ML IN SOLN
3.0000 mL | Freq: Four times a day (QID) | RESPIRATORY_TRACT | Status: DC | PRN
  Administered 2019-03-02: 3 mL via RESPIRATORY_TRACT

## 2019-03-02 MED ORDER — IPRATROPIUM-ALBUTEROL 0.5-2.5 (3) MG/3ML IN SOLN
RESPIRATORY_TRACT | Status: AC
  Administered 2019-03-02: 16:00:00 3 mL via RESPIRATORY_TRACT
  Filled 2019-03-02: qty 3

## 2019-03-02 MED ORDER — ACETAMINOPHEN 160 MG/5ML PO SOLN: 325.0000 mg | ORAL | Status: DC | PRN

## 2019-03-02 MED ORDER — SUGAMMADEX SODIUM 200 MG/2ML IV SOLN
INTRAVENOUS | Status: DC | PRN
  Administered 2019-03-02: 100 mg via INTRAVENOUS

## 2019-03-02 MED ORDER — ONDANSETRON HCL 4 MG/2ML IJ SOLN
INTRAMUSCULAR | Status: DC | PRN
  Administered 2019-03-02: 4 mg via INTRAVENOUS

## 2019-03-02 MED ORDER — SUGAMMADEX SODIUM 200 MG/2ML IV SOLN
INTRAVENOUS | Status: AC
  Filled 2019-03-02: qty 2

## 2019-03-02 MED ORDER — BUPIVACAINE LIPOSOME 1.3 % IJ SUSP
INTRAMUSCULAR | Status: AC
  Filled 2019-03-02: qty 20

## 2019-03-02 MED ORDER — HYDROCODONE-ACETAMINOPHEN 7.5-325 MG PO TABS: 1.0000 | ORAL_TABLET | Freq: Once | ORAL | Status: DC | PRN

## 2019-03-02 MED ORDER — FENTANYL CITRATE (PF) 100 MCG/2ML IJ SOLN: 25.0000 ug | INTRAMUSCULAR | Status: DC | PRN

## 2019-03-02 MED ORDER — PROMETHAZINE HCL 25 MG/ML IJ SOLN: 6.2500 mg | INTRAMUSCULAR | Status: DC | PRN

## 2019-03-02 MED ORDER — ONDANSETRON 4 MG PO TBDP: 4.0000 mg | ORAL_TABLET | Freq: Three times a day (TID) | ORAL | 0 refills | Status: AC | PRN

## 2019-03-02 MED ORDER — PHENYLEPHRINE HCL (PRESSORS) 10 MG/ML IV SOLN
INTRAVENOUS | Status: DC | PRN
  Administered 2019-03-02: 200 ug via INTRAVENOUS

## 2019-03-02 MED ORDER — CEFAZOLIN SODIUM-DEXTROSE 1-4 GM/50ML-% IV SOLN: 1.0000 g | Freq: Once | INTRAVENOUS | Status: DC

## 2019-03-02 MED ORDER — FENTANYL CITRATE (PF) 100 MCG/2ML IJ SOLN
INTRAMUSCULAR | Status: AC
  Filled 2019-03-02: qty 2

## 2019-03-02 MED ORDER — PROPOFOL 10 MG/ML IV BOLUS
INTRAVENOUS | Status: AC
  Filled 2019-03-02: qty 20

## 2019-03-02 MED ORDER — ROCURONIUM BROMIDE 100 MG/10ML IV SOLN
INTRAVENOUS | Status: DC | PRN
  Administered 2019-03-02: 20 mg via INTRAVENOUS

## 2019-03-02 MED ORDER — BUPIVACAINE HCL (PF) 0.5 % IJ SOLN
INTRAMUSCULAR | Status: AC
  Filled 2019-03-02: qty 10

## 2019-03-02 MED ORDER — SUCCINYLCHOLINE CHLORIDE 20 MG/ML IJ SOLN
INTRAMUSCULAR | Status: DC | PRN
  Administered 2019-03-02: 120 mg via INTRAVENOUS

## 2019-03-02 MED ORDER — FENTANYL CITRATE (PF) 100 MCG/2ML IJ SOLN
INTRAMUSCULAR | Status: AC
  Administered 2019-03-02: 14:00:00 50 ug via INTRAVENOUS
  Filled 2019-03-02: qty 2

## 2019-03-02 MED ORDER — DEXAMETHASONE SODIUM PHOSPHATE 10 MG/ML IJ SOLN
INTRAMUSCULAR | Status: DC | PRN
  Administered 2019-03-02: 10 mg via INTRAVENOUS

## 2019-03-02 MED ORDER — IPRATROPIUM-ALBUTEROL 0.5-2.5 (3) MG/3ML IN SOLN: 3.0000 mL | Freq: Once | RESPIRATORY_TRACT | Status: DC

## 2019-03-02 MED ORDER — BUPIVACAINE HCL (PF) 0.5 % IJ SOLN
INTRAMUSCULAR | Status: DC | PRN
  Administered 2019-03-02: 10 mL

## 2019-03-02 MED ORDER — PROPOFOL 10 MG/ML IV BOLUS
INTRAVENOUS | Status: DC | PRN
  Administered 2019-03-02: 120 mg via INTRAVENOUS

## 2019-03-02 MED ORDER — LIDOCAINE HCL (PF) 1 % IJ SOLN
INTRAMUSCULAR | Status: AC
  Filled 2019-03-02: qty 5

## 2019-03-02 MED ORDER — ACETAMINOPHEN 500 MG PO TABS: 1000.0000 mg | ORAL_TABLET | Freq: Three times a day (TID) | ORAL | 2 refills | Status: AC

## 2019-03-02 MED ORDER — MIDAZOLAM HCL 2 MG/2ML IJ SOLN
INTRAMUSCULAR | Status: AC
  Filled 2019-03-02: qty 2

## 2019-03-02 MED ORDER — ACETAMINOPHEN 325 MG PO TABS: 325.0000 mg | ORAL_TABLET | ORAL | Status: DC | PRN

## 2019-03-02 MED ORDER — BUPIVACAINE LIPOSOME 1.3 % IJ SUSP
INTRAMUSCULAR | Status: DC | PRN
  Administered 2019-03-02: 15 mL

## 2019-03-02 MED ORDER — LACTATED RINGERS IV SOLN
INTRAVENOUS | Status: DC
  Administered 2019-03-02: 11:00:00 via INTRAVENOUS

## 2019-03-02 MED ORDER — FENTANYL CITRATE (PF) 100 MCG/2ML IJ SOLN
50.0000 ug | Freq: Once | INTRAMUSCULAR | Status: AC
  Administered 2019-03-02: 14:00:00 50 ug via INTRAVENOUS

## 2019-03-02 MED ORDER — MEPERIDINE HCL 50 MG/ML IJ SOLN: 6.2500 mg | INTRAMUSCULAR | Status: DC | PRN

## 2019-03-02 MED ORDER — TRAMADOL HCL 50 MG PO TABS: 50.0000 mg | ORAL_TABLET | ORAL | 0 refills | Status: AC | PRN

## 2019-03-02 SURGICAL SUPPLY — 48 items
CANISTER SUCT 1200ML W/VALVE (MISCELLANEOUS) IMPLANT
CANISTER SUCT 3000ML PPV (MISCELLANEOUS) IMPLANT
CHLORAPREP W/TINT 26 (MISCELLANEOUS) IMPLANT
CNTNR SPEC 2.5X3XGRAD LEK (MISCELLANEOUS)
CONT SPEC 4OZ STER OR WHT (MISCELLANEOUS)
CONTAINER SPEC 2.5X3XGRAD LEK (MISCELLANEOUS) IMPLANT
COVER WAND RF STERILE (DRAPES) IMPLANT
DRAPE 3/4 80X56 (DRAPES) IMPLANT
DRAPE C-ARM 42X72 X-RAY (DRAPES) IMPLANT
DRAPE C-ARM XRAY 36X54 (DRAPES) IMPLANT
DRAPE INCISE IOBAN 66X45 STRL (DRAPES) IMPLANT
DRAPE SPLIT 6X30 W/TAPE (DRAPES) IMPLANT
DRAPE U-SHAPE 47X51 STRL (DRAPES) IMPLANT
ELECT CAUTERY BLADE 6.4 (BLADE) IMPLANT
ELECT REM PT RETURN 9FT ADLT (ELECTROSURGICAL)
ELECTRODE REM PT RTRN 9FT ADLT (ELECTROSURGICAL) IMPLANT
GLOVE BIOGEL PI IND STRL 8 (GLOVE) IMPLANT
GLOVE BIOGEL PI INDICATOR 8 (GLOVE)
GLOVE SURG ORTHO 8.0 STRL STRW (GLOVE) IMPLANT
GOWN STRL REUS W/ TWL LRG LVL3 (GOWN DISPOSABLE) IMPLANT
GOWN STRL REUS W/ TWL XL LVL3 (GOWN DISPOSABLE) IMPLANT
GOWN STRL REUS W/TWL LRG LVL3 (GOWN DISPOSABLE)
GOWN STRL REUS W/TWL XL LVL3 (GOWN DISPOSABLE)
HOLDER FOLEY CATH W/STRAP (MISCELLANEOUS) IMPLANT
IMMOBILIZER SHDR SM LX WHT (SOFTGOODS) ×2 IMPLANT
KIT STABILIZATION SHOULDER (MISCELLANEOUS) IMPLANT
KIT TURNOVER KIT A (KITS) IMPLANT
MARKER SKIN DUAL TIP RULER LAB (MISCELLANEOUS) IMPLANT
MASK FACE SPIDER DISP (MASK) IMPLANT
MAT ABSORB  FLUID 56X50 GRAY (MISCELLANEOUS)
MAT ABSORB FLUID 56X50 GRAY (MISCELLANEOUS) IMPLANT
NEEDLE HYPO 25X1 1.5 SAFETY (NEEDLE) IMPLANT
NS IRRIG 1000ML POUR BTL (IV SOLUTION) IMPLANT
PACK ARTHROSCOPY SHOULDER (MISCELLANEOUS) IMPLANT
PENCIL SMOKE ULTRAEVAC 22 CON (MISCELLANEOUS) IMPLANT
RETRIEVER SUT HEWSON (MISCELLANEOUS) IMPLANT
SLING ARM S TX990203 (SOFTGOODS) ×2 IMPLANT
SOL .9 NS 3000ML IRR  AL (IV SOLUTION)
SOL .9 NS 3000ML IRR UROMATIC (IV SOLUTION) IMPLANT
SPONGE LAP 18X18 RF (DISPOSABLE) IMPLANT
STAPLER SKIN PROX 35W (STAPLE) IMPLANT
STRAP SAFETY 5IN WIDE (MISCELLANEOUS) IMPLANT
SUT ETHIBOND #5 BRAIDED 30INL (SUTURE) IMPLANT
SUT FIBERWIRE #2 38 BLUE 1/2 (SUTURE)
SUT TICRON 2-0 30IN 311381 (SUTURE) IMPLANT
SUT VIC AB 0 CT1 36 (SUTURE) IMPLANT
SUTURE FIBERWR #2 38 BLUE 1/2 (SUTURE) IMPLANT
TRAY FOLEY SLVR 16FR LF STAT (SET/KITS/TRAYS/PACK) IMPLANT

## 2019-03-02 NOTE — Anesthesia Post-op Follow-up Note (Signed)
Anesthesia QCDR form completed.        

## 2019-03-02 NOTE — Op Note (Signed)
Operative Note    SURGERY DATE: 03/02/2019   PRE-OP DIAGNOSIS:  1.  Right glenohumeral subacute dislocation   POST-OP DIAGNOSIS:  1.  Right glenohumeral subacute dislocation   PROCEDURE(S): 1. Closed reduction of subacute right glenohumeral dislocation under anesthesia  SURGEON: Cato Mulligan, MD    ANESTHESIA: Gen   ESTIMATED BLOOD LOSS: none   TOTAL IV FLUIDS: none  INDICATION(S): Dawn Carpenter is a 73 y.o. female who sustained a R glenohumeral joint dislocation on 02/04/2019 when she rolled off of her couch onto the right arm.  She was evaluated by her PCP and was then referred to orthopedics after radiographs were obtained.  However it took her almost 2 weeks to be evaluated.  She was initially scheduled for close reduction under anesthesia last week, but she was deemed unsafe for anesthesia due to Atrial fibrillation with RVR.  This was managed as an outpatient and she was deemed to be medically optimized. After discussion of risks, benefits, and alternatives to surgery, the patient and family elected to proceed with above procedure.   OPERATIVE FINDINGS: Anterior inferior glenohumeral dislocation on the right side   OPERATIVE REPORT:   The patient was seen in the Holding Room. The patient and family concurred with the proposed plan, giving informed consent. The site of surgery was properly noted/marked. The patient was taken to Operating Room. A Time Out was held and the patient identity, procedure, and laterality was confirmed. After administration of adequate anesthesia, multiple reduction maneuvers were attempted including the FARES, Kocher, Spaso, external rotation, and traction/countertraction methods.  The shoulder was still dislocated despite multiple attempts at these efforts.  Finally, the Singh's modification to Milch's maneuver was attempted.  This resulted in a successful reduction with a palpable clunk.  The shoulder was noted to be quite unstable as she was able to  be dislocated with the arm in 45 degrees of abduction and 45 degrees of external rotation.  The shoulder was reduced again.  Fluoroscopy confirmed glenohumeral joint reduction.  She was placed in a shoulder immobilizer.  Repeat fluoroscopy showed that there was some mild anterior subluxation, but clinically the shoulder remained in a reduced position. The patient was awakened from anesthesia without any further complication and transferred to PACU for further recovery.     Of note, there is increased complexity to this glenohumeral joint reduction given that this had to be performed in a subacute fashion approximately 4 weeks after initial injury.  Reduction required multiple attempts and multiple different methods before successful reduction was achieved.  This added approximately 30 minutes to the surgical time.   POST-OPERATIVE PLAN:  The patient will be NWB on operative extremity.  Maintain shoulder immobilizer at all times.  Follow-up in approximately 2 weeks.

## 2019-03-02 NOTE — Anesthesia Procedure Notes (Signed)
Procedure Name: Intubation Date/Time: 03/02/2019 2:44 PM Performed by: Philbert Riser, CRNA Pre-anesthesia Checklist: Patient identified, Emergency Drugs available, Suction available, Patient being monitored and Timeout performed Patient Re-evaluated:Patient Re-evaluated prior to induction Oxygen Delivery Method: Circle system utilized and Simple face mask Preoxygenation: Pre-oxygenation with 100% oxygen Induction Type: IV induction Ventilation: Mask ventilation without difficulty Grade View: Grade I Tube type: Oral Tube size: 7.0 mm Number of attempts: 1 Airway Equipment and Method: Stylet Placement Confirmation: ETT inserted through vocal cords under direct vision,  positive ETCO2 and breath sounds checked- equal and bilateral Secured at: 21 cm Tube secured with: Tape Dental Injury: Teeth and Oropharynx as per pre-operative assessment

## 2019-03-02 NOTE — Anesthesia Postprocedure Evaluation (Signed)
Anesthesia Post Note  Patient: Dawn Carpenter  Procedure(s) Performed: CLOSED REDUCTION RIGHT SHOULDER (Right Shoulder)  Patient location during evaluation: PACU Anesthesia Type: General Level of consciousness: awake and alert Pain management: pain level controlled Vital Signs Assessment: post-procedure vital signs reviewed and stable Respiratory status: spontaneous breathing and respiratory function stable Cardiovascular status: stable Anesthetic complications: no     Last Vitals:  Vitals:   03/02/19 1656 03/02/19 1733  BP: (!) 160/87 (!) 165/80  Pulse: 73 73  Resp: 18 16  Temp: (!) 36.3 C   SpO2: 93% 92%    Last Pain:  Vitals:   03/02/19 1656  TempSrc: Temporal  PainSc: 0-No pain                 KEPHART,WILLIAM K

## 2019-03-02 NOTE — H&P (Signed)
Paper H&P to be scanned into permanent record. H&P reviewed. No significant changes noted.  

## 2019-03-02 NOTE — Anesthesia Procedure Notes (Signed)
Anesthesia Regional Block: Interscalene brachial plexus block   Pre-Anesthetic Checklist: ,, timeout performed, Correct Patient, Correct Site, Correct Laterality, Correct Procedure, Correct Position, site marked, Risks and benefits discussed,  Surgical consent,  Pre-op evaluation,  At surgeon's request and post-op pain management  Laterality: Left  Prep: chloraprep       Needles:  Injection technique: Single-shot  Needle Type: Echogenic Stimulator Needle     Needle Length: 10cm  Needle Gauge: 21   Needle insertion depth: 4 cm   Additional Needles:   Procedures: Doppler guided,,,, ultrasound used (permanent image in chart),,,,  Narrative:  Start time: 03/02/2019 1:56 PM End time: 03/02/2019 2:00 PM Injection made incrementally with aspirations every 5 mL.  Events:,,,,,,,,,,, other event  Performed by: Personally  Anesthesiologist: Alphonsus Sias, MD  Additional Notes: Functioning IV was confirmed and monitors were applied, O2 [place], mild sedation.  A 151mm 21ga ECHOplex needle was used. Sterile prep and drape,hand hygiene and sterile gloves were used.  Negative aspiration and negative test dose prior to incremental administration of local anesthetic. The patient tolerated the procedure well.

## 2019-03-02 NOTE — Transfer of Care (Signed)
Immediate Anesthesia Transfer of Care Note  Patient: Dawn Carpenter  Procedure(s) Performed: Procedure(s): CLOSED REDUCTION RIGHT SHOULDER (Right)  Patient Location: PACU  Anesthesia Type:General  Level of Consciousness: sedated  Airway & Oxygen Therapy: Patient Spontanous Breathing and Patient connected to face mask oxygen  Post-op Assessment: Report given to RN and Post -op Vital signs reviewed and stable  Post vital signs: Reviewed and stable  Last Vitals:  Vitals:   03/02/19 1406 03/02/19 1557  BP: (!) 173/84 (!) 165/81  Pulse: 75 71  Resp:  (!) 22  Temp:  (!) 36.3 C  SpO2: 97% 282%    Complications: No apparent anesthesia complications

## 2019-03-02 NOTE — Discharge Instructions (Addendum)
Post-Op Instructions  1. Bracing: Post-operative Shoulder Immobilizer MUST REMAIN ON AT ALL TIMES except hygiene or else you risk the shoulder re-dislocating. During times of hygeine, forearm must remain in front of your body over the abdomen. This will be for at least 2 weeks, if not 4 weeks.  2. Driving: No driving for at least 4-6 weeks.   3. Activity: No significant activity allowed with arm for at least the first 2 weeks while in shoulder immobilizer.  4. Physical Therapy: Will begin ~2-4 weeks after srugery.  5. Medications:  - You will be provided a prescription for narcotic pain medicine. After surgery, take 1 tablet q4h prn. You may not need any narcotics.   - A prescription for anti-nausea medication will be provided in case the narcotic medicine causes nausea - take 1 tablet every 6 hours only if nauseated.   - Take tylenol 1000 mg every 8 hours for pain.  May stop tylenol 3 days after surgery if you are having minimal pain.  6. Post-Op Appointment:  Your first post-op appointment will be 10-14 days post-op.  7.  Post-operative Shoulder Immobilizer MUST REMAIN ON AT ALL TIMES or else you risk the shoulder re-dislocating. If the shoulder is unstable postoperatively, further surgical consideration may be necessary to keep the shoulder stable.    AMBULATORY SURGERY  DISCHARGE INSTRUCTIONS   1) The drugs that you were given will stay in your system until tomorrow so for the next 24 hours you should not:  A) Drive an automobile B) Make any legal decisions C) Drink any alcoholic beverage   2) You may resume regular meals tomorrow.  Today it is better to start with liquids and gradually work up to solid foods.  You may eat anything you prefer, but it is better to start with liquids, then soup and crackers, and gradually work up to solid foods.   3) Please notify your doctor immediately if you have any unusual bleeding, trouble breathing, redness and pain at the surgery site,  drainage, fever, or pain not relieved by medication.    4) Additional Instructions:        Please contact your physician with any problems or Same Day Surgery at [phone], Monday through Friday 6 am to 4 pm, or [place] at [facility] number at [phone].

## 2019-03-02 NOTE — Anesthesia Preprocedure Evaluation (Addendum)
Anesthesia Evaluation  Patient identified by MRN, date of birth, ID band Patient awake    Reviewed: Allergy & Precautions, H&P , NPO status , reviewed documented beta blocker date and time   Airway Mallampati: II  TM Distance: >3 FB Neck ROM: Limited    Dental  (+) Chipped, Poor Dentition, Missing   Pulmonary COPD, Current Smoker and Patient abstained from smoking.,  Pt continues to smoke, abstained DOS  B rhonchi w mild exp wheezes  + rhonchi        Cardiovascular hypertension, +CHF  + dysrhythmias Atrial Fibrillation  + Systolic murmurs ECHO 12/04/6158   1. The left ventricle has severely reduced systolic function, with an ejection fraction of 25-30%. The cavity size was normal. There is mildly increased left ventricular wall thickness. Left ventricular diastolic Doppler parameters are consistent with  pseudonormalization. Left ventrical global hypokinesis without regional wall motion abnormalities.  2. The right ventricle has normal systolic function. The cavity was normal. There is no increase in right ventricular wall thickness. Right ventricular systolic pressure is moderately elevated with an estimated pressure of 46.5 mmHg.  3. Left atrial size was mildly dilated.  4. Mild stenosis of the aortic valve. Degree of stenosis may be underestimated secondary to depressed EF.  5. Mitral valve regurgitation is moderate to severe  6. Tricuspid valve regurgitation is moderate.  7. Left pleural effusion noted  Hx Afib - converted to SR on meds, Cardiology eval noted   Neuro/Psych PSYCHIATRIC DISORDERS Anxiety Depression    GI/Hepatic GERD  ,  Endo/Other  Hypothyroidism   Renal/GU      Musculoskeletal  (+) Arthritis ,   Abdominal   Peds  Hematology  (+) Blood dyscrasia, anemia ,   Anesthesia Other Findings Past Medical History: No date: Anemia No date: Anxiety No date: Arthritis     Comment:  rheumatoid No date:  CHF (congestive heart failure) (HCC) No date: Depression No date: GERD (gastroesophageal reflux disease) No date: Hemorrhoids No date: Hypertension No date: Hypothyroidism No date: Osteoporosis No date: Shoulder pain No date: Thyroid disease     Comment:  hypothyoridism  Past Surgical History: No date: ABDOMINAL HYSTERECTOMY No date: COLONOSCOPY 05/15/2015: COLONOSCOPY WITH PROPOFOL; N/A     Comment:  Procedure: COLONOSCOPY WITH PROPOFOL;  Surgeon: Manya Silvas, MD;  Location: [facility] ENDOSCOPY;  Service:               Endoscopy;  Laterality: N/A; 05/15/2015: ESOPHAGOGASTRODUODENOSCOPY (EGD) WITH PROPOFOL; N/A     Comment:  Procedure: ESOPHAGOGASTRODUODENOSCOPY (EGD) WITH               PROPOFOL;  Surgeon: Manya Silvas, MD;  Location: [facility]              ENDOSCOPY;  Service: Endoscopy;  Laterality: N/A; No date: HAND SURGERY     Comment:  for arthritis No date: UPPER GI ENDOSCOPY  BMI    Body Mass Index: 16.51 kg/m      Reproductive/Obstetrics                            Anesthesia Physical Anesthesia Plan  ASA: IV  Anesthesia Plan: General   Post-op Pain Management:  Regional for Post-op pain   Induction:   PONV Risk Score and Plan: Treatment may vary due to age or medical condition, TIVA, Ondansetron and Midazolam  Airway Management Planned:  Additional Equipment:   Intra-op Plan:   Post-operative Plan:   Informed Consent: I have reviewed the patients History and Physical, chart, labs and discussed the procedure including the risks, benefits and alternatives for the proposed anesthesia with the patient or authorized representative who has indicated his/her understanding and acceptance.     Dental Advisory Given  Plan Discussed with: CRNA  Anesthesia Plan Comments:         Anesthesia Quick Evaluation

## 2019-03-15 DIAGNOSIS — S43004A Unspecified dislocation of right shoulder joint, initial encounter: Secondary | ICD-10-CM | POA: Diagnosis not present

## 2019-04-17 ENCOUNTER — Other Ambulatory Visit: Payer: Self-pay | Admitting: Nurse Practitioner

## 2019-04-17 DIAGNOSIS — F411 Generalized anxiety disorder: Secondary | ICD-10-CM

## 2019-04-17 MED ORDER — ALPRAZOLAM 0.25 MG PO TABS: 0.1250 mg | ORAL_TABLET | Freq: Two times a day (BID) | ORAL | 1 refills | Status: AC | PRN

## 2019-04-17 NOTE — Progress Notes (Signed)
Renewed prescription for alprazolam 0.125mg  twice daily if needed for anxiety per pharmacy request.

## 2019-05-15 ENCOUNTER — Inpatient Hospital Stay: Payer: PPO | Admitting: Oncology

## 2019-05-15 ENCOUNTER — Inpatient Hospital Stay: Payer: PPO | Attending: Oncology

## 2019-05-15 ENCOUNTER — Inpatient Hospital Stay: Payer: PPO

## 2019-05-19 ENCOUNTER — Ambulatory Visit: Payer: Self-pay | Admitting: Nurse Practitioner

## 2019-05-21 DEATH — deceased
